# Patient Record
Sex: Female | Born: 2000 | Race: Black or African American | Hispanic: No | Marital: Single | Smoking: Never smoker
Health system: Southern US, Community
[De-identification: ages and names within clinical notes are randomized; demographics above are authoritative.]

## PROBLEM LIST (undated history)

## (undated) ENCOUNTER — Inpatient Hospital Stay (HOSPITAL_COMMUNITY): Payer: Self-pay

## (undated) DIAGNOSIS — T7840XA Allergy, unspecified, initial encounter: Secondary | ICD-10-CM

## (undated) DIAGNOSIS — J45909 Unspecified asthma, uncomplicated: Secondary | ICD-10-CM

## (undated) DIAGNOSIS — L309 Dermatitis, unspecified: Secondary | ICD-10-CM

## (undated) DIAGNOSIS — D509 Iron deficiency anemia, unspecified: Secondary | ICD-10-CM

## (undated) HISTORY — DX: Unspecified asthma, uncomplicated: J45.909

## (undated) HISTORY — DX: Dermatitis, unspecified: L30.9

## (undated) HISTORY — DX: Allergy, unspecified, initial encounter: T78.40XA

## (undated) HISTORY — PX: NO PAST SURGERIES: SHX2092

## (undated) HISTORY — DX: Iron deficiency anemia, unspecified: D50.9

---
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
## 2020-01-03 DIAGNOSIS — F913 Oppositional defiant disorder: Secondary | ICD-10-CM | POA: Diagnosis not present

## 2020-01-10 DIAGNOSIS — F913 Oppositional defiant disorder: Secondary | ICD-10-CM | POA: Diagnosis not present

## 2020-02-16 ENCOUNTER — Encounter: Payer: Self-pay | Admitting: Family Medicine

## 2020-02-16 ENCOUNTER — Other Ambulatory Visit: Payer: Self-pay

## 2020-02-16 ENCOUNTER — Ambulatory Visit (INDEPENDENT_AMBULATORY_CARE_PROVIDER_SITE_OTHER): Payer: Medicaid Other | Admitting: Family Medicine

## 2020-02-16 DIAGNOSIS — Z30013 Encounter for initial prescription of injectable contraceptive: Secondary | ICD-10-CM

## 2020-02-16 DIAGNOSIS — J4520 Mild intermittent asthma, uncomplicated: Secondary | ICD-10-CM | POA: Diagnosis not present

## 2020-02-16 LAB — POCT URINE PREGNANCY: Preg Test, Ur: NEGATIVE

## 2020-02-16 MED ORDER — MEDROXYPROGESTERONE ACETATE 150 MG/ML IM SUSP
150.0000 mg | Freq: Once | INTRAMUSCULAR | Status: AC
  Administered 2020-02-16: 09:00:00 150 mg via INTRAMUSCULAR

## 2020-02-16 NOTE — Assessment & Plan Note (Signed)
Advised patient to get the Covid vaccine however she declined.  She has received influenza vaccine.  On exam, mild wheezes diffusely.  Advised patient to use her albuterol inhaler when she returns home.

## 2020-02-16 NOTE — Patient Instructions (Addendum)
Glad to hear that you and baby [place] are doing well!  Please schedule your next Depo-Provera shot between March 16 - May 16, 2020.  For the next week continue to use barrier protection pregnancy.  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  If you have any questions or concerns do not hesitate to call the office at [phone].  Take Care,   Dr.Keana Dueitt

## 2020-02-16 NOTE — Progress Notes (Signed)
   SUBJECTIVE:   CHIEF COMPLAINT / HPI:   Chief Complaint  Patient presents with  . Postpartum Care     Heather [name] is a 19 y.o. female who presents for a postpartum visit. She is 9 weeks postpartum following a spontaneous vaginal delivery. I have fully reviewed the prenatal and intrapartum course. The delivery was at 40 gestational weeks. Outcome: spontaneous vaginal delivery. Anesthesia: epidural. Postpartum course has been uncomplicated. Baby's course has been uncomplicated. Baby is feeding by both breast and bottle. No bleeding. Bowel function is normal. Bladder function is normal. Patient is sexually active.  Patient would like for Provera prescription.  LMP 01/20/20 bleeding was normal however she experienced more cramping with this period.   Objective:    BP 122/70   Pulse 83   Ht 5\' 5"  (1.651 m)   Wt 230 lb 12.8 oz (104.7 kg)   LMP 01/20/2019 (Approximate)   SpO2 99%   BMI 38.41 kg/m   General:  alert, appears stated age and no distress  Lungs: wheezes bilaterally, no increased work of breathing  Heart:  regular rate and rhythm, S1, S2 normal, no murmur, click, rub or gallop  Abdomen: soft, non-tender; bowel sounds normal; no masses,  no organomegaly    Skin:  warm dry         ASSESSMENT/PLAN:     Asthma Advised patient to get the Covid vaccine however she declined.  She has received influenza vaccine.  On exam, mild wheezes diffusely.  Advised patient to use her albuterol inhaler when she returns home.     Rosalynd was seen today for postpartum care.  Pap smear: N/A (age)   1.  Urine pregnancy test negative.  Contraception: Depo-Provera injections.  Next shot March 16 - May 16, 2020. 2.  Postpartum depression screen negative.  Follow up in: 3 months or as needed. reviewed and updated as appropriate       May 18, 2020, DO PGY-2, [facility] 02/16/2020

## 2020-04-26 DIAGNOSIS — F913 Oppositional defiant disorder: Secondary | ICD-10-CM | POA: Diagnosis not present

## 2020-05-03 DIAGNOSIS — F913 Oppositional defiant disorder: Secondary | ICD-10-CM | POA: Diagnosis not present

## 2020-05-10 DIAGNOSIS — F913 Oppositional defiant disorder: Secondary | ICD-10-CM | POA: Diagnosis not present

## 2020-05-17 DIAGNOSIS — F3481 Disruptive mood dysregulation disorder: Secondary | ICD-10-CM | POA: Diagnosis not present

## 2020-05-24 DIAGNOSIS — F913 Oppositional defiant disorder: Secondary | ICD-10-CM | POA: Diagnosis not present

## 2020-05-31 DIAGNOSIS — F913 Oppositional defiant disorder: Secondary | ICD-10-CM | POA: Diagnosis not present

## 2020-06-06 DIAGNOSIS — F913 Oppositional defiant disorder: Secondary | ICD-10-CM | POA: Diagnosis not present

## 2020-06-13 DIAGNOSIS — F913 Oppositional defiant disorder: Secondary | ICD-10-CM | POA: Diagnosis not present

## 2020-06-16 DIAGNOSIS — F3481 Disruptive mood dysregulation disorder: Secondary | ICD-10-CM | POA: Diagnosis not present

## 2020-06-22 DIAGNOSIS — F913 Oppositional defiant disorder: Secondary | ICD-10-CM | POA: Diagnosis not present

## 2020-06-28 DIAGNOSIS — F913 Oppositional defiant disorder: Secondary | ICD-10-CM | POA: Diagnosis not present

## 2020-07-04 DIAGNOSIS — F913 Oppositional defiant disorder: Secondary | ICD-10-CM | POA: Diagnosis not present

## 2020-07-10 DIAGNOSIS — F913 Oppositional defiant disorder: Secondary | ICD-10-CM | POA: Diagnosis not present

## 2020-07-12 DIAGNOSIS — F913 Oppositional defiant disorder: Secondary | ICD-10-CM | POA: Diagnosis not present

## 2020-07-17 DIAGNOSIS — F3481 Disruptive mood dysregulation disorder: Secondary | ICD-10-CM | POA: Diagnosis not present

## 2020-09-05 DIAGNOSIS — F913 Oppositional defiant disorder: Secondary | ICD-10-CM | POA: Diagnosis not present

## 2020-09-12 DIAGNOSIS — F913 Oppositional defiant disorder: Secondary | ICD-10-CM | POA: Diagnosis not present

## 2020-09-19 DIAGNOSIS — F913 Oppositional defiant disorder: Secondary | ICD-10-CM | POA: Diagnosis not present

## 2020-09-26 DIAGNOSIS — F913 Oppositional defiant disorder: Secondary | ICD-10-CM | POA: Diagnosis not present

## 2020-10-02 DIAGNOSIS — F913 Oppositional defiant disorder: Secondary | ICD-10-CM | POA: Diagnosis not present

## 2020-10-10 DIAGNOSIS — F3481 Disruptive mood dysregulation disorder: Secondary | ICD-10-CM | POA: Diagnosis not present

## 2020-10-17 DIAGNOSIS — F3481 Disruptive mood dysregulation disorder: Secondary | ICD-10-CM | POA: Diagnosis not present

## 2020-10-24 DIAGNOSIS — F3481 Disruptive mood dysregulation disorder: Secondary | ICD-10-CM | POA: Diagnosis not present

## 2020-10-31 DIAGNOSIS — F913 Oppositional defiant disorder: Secondary | ICD-10-CM | POA: Diagnosis not present

## 2020-11-07 DIAGNOSIS — F913 Oppositional defiant disorder: Secondary | ICD-10-CM | POA: Diagnosis not present

## 2020-11-14 DIAGNOSIS — F913 Oppositional defiant disorder: Secondary | ICD-10-CM | POA: Diagnosis not present

## 2020-11-20 DIAGNOSIS — F913 Oppositional defiant disorder: Secondary | ICD-10-CM | POA: Diagnosis not present

## 2020-11-29 DIAGNOSIS — F913 Oppositional defiant disorder: Secondary | ICD-10-CM | POA: Diagnosis not present

## 2020-12-04 DIAGNOSIS — F913 Oppositional defiant disorder: Secondary | ICD-10-CM | POA: Diagnosis not present

## 2020-12-11 DIAGNOSIS — F913 Oppositional defiant disorder: Secondary | ICD-10-CM | POA: Diagnosis not present

## 2020-12-19 DIAGNOSIS — F913 Oppositional defiant disorder: Secondary | ICD-10-CM | POA: Diagnosis not present

## 2020-12-26 DIAGNOSIS — F913 Oppositional defiant disorder: Secondary | ICD-10-CM | POA: Diagnosis not present

## 2021-01-01 DIAGNOSIS — F913 Oppositional defiant disorder: Secondary | ICD-10-CM | POA: Diagnosis not present

## 2021-01-22 DIAGNOSIS — F913 Oppositional defiant disorder: Secondary | ICD-10-CM | POA: Diagnosis not present

## 2021-01-29 DIAGNOSIS — F913 Oppositional defiant disorder: Secondary | ICD-10-CM | POA: Diagnosis not present

## 2021-02-01 DIAGNOSIS — F913 Oppositional defiant disorder: Secondary | ICD-10-CM | POA: Diagnosis not present

## 2021-02-07 DIAGNOSIS — F913 Oppositional defiant disorder: Secondary | ICD-10-CM | POA: Diagnosis not present

## 2021-02-14 DIAGNOSIS — F913 Oppositional defiant disorder: Secondary | ICD-10-CM | POA: Diagnosis not present

## 2021-02-20 DIAGNOSIS — F913 Oppositional defiant disorder: Secondary | ICD-10-CM | POA: Diagnosis not present

## 2021-02-27 DIAGNOSIS — F913 Oppositional defiant disorder: Secondary | ICD-10-CM | POA: Diagnosis not present

## 2021-03-06 DIAGNOSIS — F913 Oppositional defiant disorder: Secondary | ICD-10-CM | POA: Diagnosis not present

## 2021-03-13 DIAGNOSIS — F913 Oppositional defiant disorder: Secondary | ICD-10-CM | POA: Diagnosis not present

## 2021-03-20 DIAGNOSIS — F913 Oppositional defiant disorder: Secondary | ICD-10-CM | POA: Diagnosis not present

## 2021-03-26 DIAGNOSIS — F913 Oppositional defiant disorder: Secondary | ICD-10-CM | POA: Diagnosis not present

## 2021-04-02 DIAGNOSIS — F913 Oppositional defiant disorder: Secondary | ICD-10-CM | POA: Diagnosis not present

## 2021-04-06 DIAGNOSIS — F3481 Disruptive mood dysregulation disorder: Secondary | ICD-10-CM | POA: Diagnosis not present

## 2021-04-11 DIAGNOSIS — F3481 Disruptive mood dysregulation disorder: Secondary | ICD-10-CM | POA: Diagnosis not present

## 2021-04-16 DIAGNOSIS — F913 Oppositional defiant disorder: Secondary | ICD-10-CM | POA: Diagnosis not present

## 2021-04-20 DIAGNOSIS — F913 Oppositional defiant disorder: Secondary | ICD-10-CM | POA: Diagnosis not present

## 2021-04-25 DIAGNOSIS — F913 Oppositional defiant disorder: Secondary | ICD-10-CM | POA: Diagnosis not present

## 2021-05-02 DIAGNOSIS — F913 Oppositional defiant disorder: Secondary | ICD-10-CM | POA: Diagnosis not present

## 2021-05-09 DIAGNOSIS — F913 Oppositional defiant disorder: Secondary | ICD-10-CM | POA: Diagnosis not present

## 2021-05-16 DIAGNOSIS — F913 Oppositional defiant disorder: Secondary | ICD-10-CM | POA: Diagnosis not present

## 2021-05-23 DIAGNOSIS — F913 Oppositional defiant disorder: Secondary | ICD-10-CM | POA: Diagnosis not present

## 2021-05-29 DIAGNOSIS — F913 Oppositional defiant disorder: Secondary | ICD-10-CM | POA: Diagnosis not present

## 2021-06-04 DIAGNOSIS — F913 Oppositional defiant disorder: Secondary | ICD-10-CM | POA: Diagnosis not present

## 2021-06-08 DIAGNOSIS — F913 Oppositional defiant disorder: Secondary | ICD-10-CM | POA: Diagnosis not present

## 2021-06-14 DIAGNOSIS — F913 Oppositional defiant disorder: Secondary | ICD-10-CM | POA: Diagnosis not present

## 2021-06-20 DIAGNOSIS — F913 Oppositional defiant disorder: Secondary | ICD-10-CM | POA: Diagnosis not present

## 2021-06-27 DIAGNOSIS — F913 Oppositional defiant disorder: Secondary | ICD-10-CM | POA: Diagnosis not present

## 2021-07-03 DIAGNOSIS — F913 Oppositional defiant disorder: Secondary | ICD-10-CM | POA: Diagnosis not present

## 2021-07-09 DIAGNOSIS — F913 Oppositional defiant disorder: Secondary | ICD-10-CM | POA: Diagnosis not present

## 2021-07-13 DIAGNOSIS — F913 Oppositional defiant disorder: Secondary | ICD-10-CM | POA: Diagnosis not present

## 2021-07-19 DIAGNOSIS — F913 Oppositional defiant disorder: Secondary | ICD-10-CM | POA: Diagnosis not present

## 2021-07-24 ENCOUNTER — Encounter: Payer: Self-pay | Admitting: *Deleted

## 2021-07-25 DIAGNOSIS — F913 Oppositional defiant disorder: Secondary | ICD-10-CM | POA: Diagnosis not present

## 2021-07-30 DIAGNOSIS — F913 Oppositional defiant disorder: Secondary | ICD-10-CM | POA: Diagnosis not present

## 2021-08-02 DIAGNOSIS — F913 Oppositional defiant disorder: Secondary | ICD-10-CM | POA: Diagnosis not present

## 2021-08-07 DIAGNOSIS — F913 Oppositional defiant disorder: Secondary | ICD-10-CM | POA: Diagnosis not present

## 2021-08-13 DIAGNOSIS — F913 Oppositional defiant disorder: Secondary | ICD-10-CM | POA: Diagnosis not present

## 2021-08-20 DIAGNOSIS — F913 Oppositional defiant disorder: Secondary | ICD-10-CM | POA: Diagnosis not present

## 2021-09-24 DIAGNOSIS — F913 Oppositional defiant disorder: Secondary | ICD-10-CM | POA: Diagnosis not present

## 2021-10-01 DIAGNOSIS — F913 Oppositional defiant disorder: Secondary | ICD-10-CM | POA: Diagnosis not present

## 2021-10-08 DIAGNOSIS — F913 Oppositional defiant disorder: Secondary | ICD-10-CM | POA: Diagnosis not present

## 2021-10-12 DIAGNOSIS — F913 Oppositional defiant disorder: Secondary | ICD-10-CM | POA: Diagnosis not present

## 2021-10-18 DIAGNOSIS — F913 Oppositional defiant disorder: Secondary | ICD-10-CM | POA: Diagnosis not present

## 2021-10-24 DIAGNOSIS — F913 Oppositional defiant disorder: Secondary | ICD-10-CM | POA: Diagnosis not present

## 2021-10-30 DIAGNOSIS — F913 Oppositional defiant disorder: Secondary | ICD-10-CM | POA: Diagnosis not present

## 2021-11-05 DIAGNOSIS — F913 Oppositional defiant disorder: Secondary | ICD-10-CM | POA: Diagnosis not present

## 2021-11-09 DIAGNOSIS — F913 Oppositional defiant disorder: Secondary | ICD-10-CM | POA: Diagnosis not present

## 2021-11-14 DIAGNOSIS — F913 Oppositional defiant disorder: Secondary | ICD-10-CM | POA: Diagnosis not present

## 2021-11-21 DIAGNOSIS — F913 Oppositional defiant disorder: Secondary | ICD-10-CM | POA: Diagnosis not present

## 2021-11-27 DIAGNOSIS — F913 Oppositional defiant disorder: Secondary | ICD-10-CM | POA: Diagnosis not present

## 2021-11-30 DIAGNOSIS — F913 Oppositional defiant disorder: Secondary | ICD-10-CM | POA: Diagnosis not present

## 2021-12-07 DIAGNOSIS — F913 Oppositional defiant disorder: Secondary | ICD-10-CM | POA: Diagnosis not present

## 2021-12-14 DIAGNOSIS — F913 Oppositional defiant disorder: Secondary | ICD-10-CM | POA: Diagnosis not present

## 2021-12-20 DIAGNOSIS — F419 Anxiety disorder, unspecified: Secondary | ICD-10-CM | POA: Diagnosis not present

## 2021-12-25 DIAGNOSIS — F419 Anxiety disorder, unspecified: Secondary | ICD-10-CM | POA: Diagnosis not present

## 2022-01-01 DIAGNOSIS — F419 Anxiety disorder, unspecified: Secondary | ICD-10-CM | POA: Diagnosis not present

## 2022-01-07 DIAGNOSIS — F913 Oppositional defiant disorder: Secondary | ICD-10-CM | POA: Diagnosis not present

## 2022-01-11 DIAGNOSIS — F913 Oppositional defiant disorder: Secondary | ICD-10-CM | POA: Diagnosis not present

## 2022-01-16 DIAGNOSIS — F419 Anxiety disorder, unspecified: Secondary | ICD-10-CM | POA: Diagnosis not present

## 2022-01-21 DIAGNOSIS — F419 Anxiety disorder, unspecified: Secondary | ICD-10-CM | POA: Diagnosis not present

## 2022-01-25 DIAGNOSIS — F419 Anxiety disorder, unspecified: Secondary | ICD-10-CM | POA: Diagnosis not present

## 2022-01-30 DIAGNOSIS — F419 Anxiety disorder, unspecified: Secondary | ICD-10-CM | POA: Diagnosis not present

## 2022-02-06 DIAGNOSIS — F419 Anxiety disorder, unspecified: Secondary | ICD-10-CM | POA: Diagnosis not present

## 2022-02-13 DIAGNOSIS — F419 Anxiety disorder, unspecified: Secondary | ICD-10-CM | POA: Diagnosis not present

## 2022-02-20 DIAGNOSIS — F419 Anxiety disorder, unspecified: Secondary | ICD-10-CM | POA: Diagnosis not present

## 2022-02-22 ENCOUNTER — Ambulatory Visit (INDEPENDENT_AMBULATORY_CARE_PROVIDER_SITE_OTHER): Payer: Medicaid Other | Admitting: Student

## 2022-02-22 VITALS — BP 114/76 | HR 93 | Temp 97.9°F | Ht 65.0 in | Wt 222.2 lb

## 2022-02-22 DIAGNOSIS — J4520 Mild intermittent asthma, uncomplicated: Secondary | ICD-10-CM | POA: Diagnosis not present

## 2022-02-22 DIAGNOSIS — J069 Acute upper respiratory infection, unspecified: Secondary | ICD-10-CM

## 2022-02-22 DIAGNOSIS — L308 Other specified dermatitis: Secondary | ICD-10-CM

## 2022-02-22 DIAGNOSIS — R0981 Nasal congestion: Secondary | ICD-10-CM | POA: Diagnosis not present

## 2022-02-22 MED ORDER — ALBUTEROL SULFATE 108 (90 BASE) MCG/ACT IN AEPB: 2.0000 | INHALATION_SPRAY | Freq: Four times a day (QID) | RESPIRATORY_TRACT | 11 refills | Status: DC | PRN

## 2022-02-22 MED ORDER — FLUTICASONE PROPIONATE 50 MCG/ACT NA SUSP: 1.0000 | Freq: Every day | NASAL | 2 refills | Status: AC | PRN

## 2022-02-22 MED ORDER — TRIAMCINOLONE ACETONIDE 0.1 % EX CREA: 1.0000 | TOPICAL_CREAM | Freq: Every day | CUTANEOUS | 2 refills | Status: AC

## 2022-02-22 NOTE — Patient Instructions (Signed)
Thank you for coming in today!    Fluids: make sure you drink enough Pedialyte or Gatorade is okay too if you aren't eating normally.   Eating or drinking warm liquids such as tea or chicken soup may help with nasal congestion   Treatment: there is no medication for a cold - for kids 1 years or older: give 1 tablespoon of honey 3-4 times a day - for kids younger than 22 years old you can give 1 tablespoon of agave nectar 3-4 times a day.   - Chamomile tea has antiviral properties. For children > 52 months of age you may give 1-2 ounces of chamomile tea twice daily    I will call with the results  Shalamar Plourde Zigmund Daniel

## 2022-02-22 NOTE — Assessment & Plan Note (Signed)
Severe, triamcinolone refill given

## 2022-02-22 NOTE — Progress Notes (Signed)
SUBJECTIVE:   CHIEF COMPLAINT / HPI:   Congestion:  Patient presenting for acute visit with HA, body aches and congestion onset 2 days ago. PO intake is decreased but trying to eat soup, drinking some. Taste has been decreased. Voiding and normal BM. Fever to 101. Feels that the symptoms are limited to the upper respiratory tract.  Her kids are now sick.   Asthma hx, used inhaler since she has been sick but generally does not need it.   PERTINENT  PMH / PSH: Allergy, Anemia, Asthma, eczema   OBJECTIVE:  BP 114/76   Pulse 93   Temp 97.9 F (36.6 C)   Ht 5\' 5"  (1.651 m)   Wt 222 lb 3.2 oz (100.8 kg)   LMP 02/17/2022   SpO2 100%   BMI 36.98 kg/m  Physical Exam Vitals reviewed.  Constitutional:      Appearance: Normal appearance.  HENT:     Head: Normocephalic.     Nose: Nose normal.     Mouth/Throat:     Mouth: Mucous membranes are moist.  Eyes:     Conjunctiva/sclera: Conjunctivae normal.  Cardiovascular:     Rate and Rhythm: Normal rate and regular rhythm.  Pulmonary:     Effort: Pulmonary effort is normal. No respiratory distress.     Breath sounds: Wheezing (slight expiratory wheeze) present.  Musculoskeletal:     Cervical back: Normal range of motion.  Skin:    Capillary Refill: Capillary refill takes less than 2 seconds.     Comments: Eczema with hyperpigmentation over feet and lower extremities   Neurological:     General: No focal deficit present.     Mental Status: She is alert.  Psychiatric:        Mood and Affect: Mood normal.        Behavior: Behavior normal.      ASSESSMENT/PLAN:  Viral upper respiratory tract infection Assessment & Plan: Likely viral in origin.  Acute cough could also be caused by an exacerbation of an upper airway cough syndrome secondary to rhinosinusitis, asthma, COPD, or pneumonia. Unlikely PNA with no focal diminishment on exam or systemic symptoms. No history of COPD, asthma seems well controlled today with rx given to patient  for refill. No signs of GERD (treat with PPI), or rhinosinusitis. Honey can provide symptomatic relief, can also try humidifier at home, Tylenol/Ibuprofen as needed. If symptoms remain in the next couple of weeks or worsen, patient was instructed to return.     Orders: -     Coronavirus ([id number]) with Influenza A and Influenza B -     Albuterol Sulfate; Inhale 2 puffs into the lungs every 6 (six) hours as needed (wheezing and shortness of breath).  Dispense: 1 each; Refill: 11 -     Fluticasone Propionate; Place 1-2 sprays into both nostrils daily as needed for allergies.  Dispense: 9.9 mL; Refill: 2  Other eczema Assessment & Plan: Severe, triamcinolone refill given   Orders: -     Triamcinolone Acetonide; Apply 1 Application topically daily.  Dispense: 30 g; Refill: 2  Mild intermittent asthma without complication -     Albuterol Sulfate; Inhale 2 puffs into the lungs every 6 (six) hours as needed (wheezing and shortness of breath).  Dispense: 1 each; Refill: 11 -     Fluticasone Propionate; Place 1-2 sprays into both nostrils daily as needed for allergies.  Dispense: 9.9 mL; Refill: 2    Erskine Emery, MD 02/22/2022, 1:09 PM    PGY-2,  [place]

## 2022-02-22 NOTE — Assessment & Plan Note (Signed)
Likely viral in origin.  Acute cough could also be caused by an exacerbation of an upper airway cough syndrome secondary to rhinosinusitis, asthma, COPD, or pneumonia. Unlikely PNA with no focal diminishment on exam or systemic symptoms. No history of COPD, asthma seems well controlled today with rx given to patient for refill. No signs of GERD (treat with PPI), or rhinosinusitis. Honey can provide symptomatic relief, can also try humidifier at home, Tylenol/Ibuprofen as needed. If symptoms remain in the next couple of weeks or worsen, patient was instructed to return.

## 2022-02-24 LAB — COVID-19, FLU A+B NAA
Influenza A, NAA: NOT DETECTED
Influenza B, NAA: NOT DETECTED
SARS-CoV-2, NAA: NOT DETECTED

## 2022-02-27 DIAGNOSIS — F419 Anxiety disorder, unspecified: Secondary | ICD-10-CM | POA: Diagnosis not present

## 2022-03-02 ENCOUNTER — Encounter: Payer: Self-pay | Admitting: Student

## 2022-03-05 DIAGNOSIS — F419 Anxiety disorder, unspecified: Secondary | ICD-10-CM | POA: Diagnosis not present

## 2022-03-13 DIAGNOSIS — F419 Anxiety disorder, unspecified: Secondary | ICD-10-CM | POA: Diagnosis not present

## 2022-03-20 DIAGNOSIS — F419 Anxiety disorder, unspecified: Secondary | ICD-10-CM | POA: Diagnosis not present

## 2022-03-21 ENCOUNTER — Other Ambulatory Visit (HOSPITAL_COMMUNITY): Payer: Self-pay

## 2022-03-21 ENCOUNTER — Telehealth: Payer: Self-pay

## 2022-03-21 NOTE — Telephone Encounter (Signed)
Rec'd PA request from pharmacy regarding patients proair inhaler.   Patients insurance covers Ventolin HFA. Could this inhaler be sent in? (Ventolin 18 gram inhaler)

## 2022-03-22 MED ORDER — ALBUTEROL SULFATE HFA 108 (90 BASE) MCG/ACT IN AERS: 2.0000 | INHALATION_SPRAY | RESPIRATORY_TRACT | 2 refills | Status: DC | PRN

## 2022-03-22 NOTE — Addendum Note (Signed)
Addended by: Erskine Emery on: 03/22/2022 12:17 PM   Modules accepted: Orders

## 2022-03-27 DIAGNOSIS — F419 Anxiety disorder, unspecified: Secondary | ICD-10-CM | POA: Diagnosis not present

## 2022-04-03 DIAGNOSIS — F419 Anxiety disorder, unspecified: Secondary | ICD-10-CM | POA: Diagnosis not present

## 2022-04-30 DIAGNOSIS — F419 Anxiety disorder, unspecified: Secondary | ICD-10-CM | POA: Diagnosis not present

## 2022-05-07 DIAGNOSIS — F419 Anxiety disorder, unspecified: Secondary | ICD-10-CM | POA: Diagnosis not present

## 2022-05-14 DIAGNOSIS — F419 Anxiety disorder, unspecified: Secondary | ICD-10-CM | POA: Diagnosis not present

## 2022-05-21 DIAGNOSIS — F419 Anxiety disorder, unspecified: Secondary | ICD-10-CM | POA: Diagnosis not present

## 2022-05-28 DIAGNOSIS — F419 Anxiety disorder, unspecified: Secondary | ICD-10-CM | POA: Diagnosis not present

## 2022-05-31 DIAGNOSIS — F419 Anxiety disorder, unspecified: Secondary | ICD-10-CM | POA: Diagnosis not present

## 2022-06-05 DIAGNOSIS — F419 Anxiety disorder, unspecified: Secondary | ICD-10-CM | POA: Diagnosis not present

## 2022-06-11 DIAGNOSIS — F411 Generalized anxiety disorder: Secondary | ICD-10-CM | POA: Diagnosis not present

## 2022-06-18 DIAGNOSIS — F419 Anxiety disorder, unspecified: Secondary | ICD-10-CM | POA: Diagnosis not present

## 2022-06-21 DIAGNOSIS — F419 Anxiety disorder, unspecified: Secondary | ICD-10-CM | POA: Diagnosis not present

## 2022-06-25 DIAGNOSIS — F419 Anxiety disorder, unspecified: Secondary | ICD-10-CM | POA: Diagnosis not present

## 2022-07-02 DIAGNOSIS — F419 Anxiety disorder, unspecified: Secondary | ICD-10-CM | POA: Diagnosis not present

## 2022-07-09 DIAGNOSIS — F419 Anxiety disorder, unspecified: Secondary | ICD-10-CM | POA: Diagnosis not present

## 2022-07-16 DIAGNOSIS — F413 Other mixed anxiety disorders: Secondary | ICD-10-CM | POA: Diagnosis not present

## 2022-07-22 DIAGNOSIS — F419 Anxiety disorder, unspecified: Secondary | ICD-10-CM | POA: Diagnosis not present

## 2022-07-29 DIAGNOSIS — F419 Anxiety disorder, unspecified: Secondary | ICD-10-CM | POA: Diagnosis not present

## 2022-09-03 ENCOUNTER — Ambulatory Visit (INDEPENDENT_AMBULATORY_CARE_PROVIDER_SITE_OTHER): Payer: Medicaid Other | Admitting: Student

## 2022-09-03 ENCOUNTER — Other Ambulatory Visit (HOSPITAL_COMMUNITY)
Admission: RE | Admit: 2022-09-03 | Discharge: 2022-09-03 | Disposition: A | Discharge status: Home / Self Care | Payer: Medicaid Other | Source: Ambulatory Visit | Attending: Family Medicine | Admitting: Family Medicine

## 2022-09-03 ENCOUNTER — Encounter: Payer: Self-pay | Admitting: Student

## 2022-09-03 VITALS — BP 125/81 | HR 79 | Ht 64.0 in | Wt 202.2 lb

## 2022-09-03 DIAGNOSIS — Z0000 Encounter for general adult medical examination without abnormal findings: Secondary | ICD-10-CM | POA: Diagnosis not present

## 2022-09-03 DIAGNOSIS — Z113 Encounter for screening for infections with a predominantly sexual mode of transmission: Secondary | ICD-10-CM

## 2022-09-03 NOTE — Progress Notes (Signed)
Annual Wellness Visit     Patient: Heather [name], Female    DOB: 10/03/2000, 22 y.o.   MRN: [id number]  Subjective  No chief complaint on file.   Heather [name] is a 22 y.o. female who presents today for her Annual Wellness Visit.  Medical concerns: Notable vaginal discharge for the past 2 weeks.  She has been sexually active with 1 partner who was sexually active with multiple partners.  Recently ended their relationship.  Has had some irritation with voiding but no pain or blood in urine.   Diet: Regular diet but avoids red meat Sleep: Sleep through the night, 6-7 hrs a night  Exercise: Yes, squat and cardios about 2-3x a week  Alcohol use:  Occasionally  Tobacco use: Yes, blacky malls occasionally Illicit drug [id number] Sexually active: Yes, female partner Works as: Leisure centre manager, Gaffer on getting CD[employer] Lives with: Mother Education officer, environmental) Code Status: Full Code   Medications: Outpatient Medications Prior to Visit  Medication Sig   acetaminophen (TYLENOL) 325 MG tablet Take 2 tablets (650 mg total) by mouth every 6 (six) hours.   albuterol (VENTOLIN HFA) 108 (90 Base) MCG/ACT inhaler Inhale 2-4 puffs into the lungs every 4 (four) hours as needed for wheezing (or cough).   coconut oil OIL Apply 1 application topically as needed.   Ferrous Sulfate Dried (FERROUS SULFATE IRON) 200 (65 Fe) MG TABS Take 1 tablet by mouth 2 (two) times daily.   fluticasone (FLONASE) 50 MCG/ACT nasal spray Place 1-2 sprays into both nostrils daily as needed for allergies.   ibuprofen (ADVIL) 600 MG tablet Take 1 tablet (600 mg total) by mouth every 6 (six) hours.   Prenatal Vit-Fe Fumarate-FA (PRENATAL MULTIVITAMIN) TABS tablet Take 1 tablet by mouth daily at 12 noon.   triamcinolone cream (KENALOG) 0.1 % Apply 1 Application topically daily.   No facility-administered medications prior to visit.    Allergies  Allergen Reactions   Dust Mite Extract Hives    Irritation    Pollen Extract Other (See  Comments)    Itchy and watery eyes    Shellfish Allergy Swelling   Objective  BP 125/81   Pulse 79   Ht 5\' 4"  (1.626 m)   Wt 202 lb 4 oz (91.7 kg)   LMP 08/05/2022   SpO2 100%   BMI 34.72 kg/m    General: Alert, well appearing, NAD HEENT: Atraumatic, MMM, No sclera icterus CV: RRR, no murmurs, normal S1/S2 Pulm: CTAB, good WOB on RA, no crackles or wheezing Abd: Soft, no distension, no tenderness Skin: dry, warm Ext: No BLE edema, +2 Pedal and radial pulse.  Genitalia:  Normal introitus for age, no external lesions, white copious vaginal discharge , mucosa pink and moist, no vaginal or cervical lesions, no vaginal atrophy, no friaility or hemorrhage, normal uterus size and position   Chaperone for exam: Tashira  CMA.   Most recent depression screenings:    02/22/2022    9:15 AM 02/16/2020    8:32 AM  PHQ 2/9 Scores  PHQ - 2 Score 0 1  PHQ- 9 Score 0 1    Assessment & Plan   Annual wellness visit done today including the all of the following: Reviewed patient's Family Medical History Reviewed and updated list of patient's medical providers Assessment of cognitive impairment was done Assessed patient's functional ability Established a written schedule for health screening services Health Risk Assessent Completed and Reviewed  Exercise Activities and Dietary recommendations  Goals   None  Immunization History  Administered Date(s) Administered   DTP 05/23/2000, 08/25/2000, 12/23/2000, 07/09/2001, 09/20/2005   HIB (PRP-OMP) 05/23/2000, 08/25/2000, 12/23/2000, 07/09/2001   Hepatitis A 09/20/2005   Hepatitis B 04/23/2000, 12/23/2000   Influenza,inj,Quad PF,6+ Mos 11/26/2019   MMR 07/09/2001, 09/20/2005   OPV 05/23/2000, 08/25/2000, 07/09/2001, 09/20/2005   Pneumococcal Conjugate-13 05/23/2000, 08/25/2000, 12/23/2000   Tdap 10/13/2019   Varicella 04/12/2002    Health Maintenance  Topic Date Due   HPV VACCINES (1 - 3-dose series) Never done   Hepatitis C  Screening  Never done   CHLAMYDIA SCREENING  11/15/2020   PAP-Cervical Cytology Screening  Never done   PAP SMEAR-Modifier  Never done   COVID-19 Vaccine (1 - 2023-24 season) 09/17/2022 (Originally 10/19/2021)   INFLUENZA VACCINE  09/19/2022   DTaP/Tdap/Td (7 - Td or Tdap) 10/12/2029   HIV Screening  Completed     Discussed health benefits of physical activity, and encouraged her to engage in regular exercise appropriate for her age and condition.    Problem List Items Addressed This Visit   None Visit Diagnoses     Encounter for wellness examination in adult    -  Primary   Overall healthy.  Patient is age appropriate and due for Pap smear.   Relevant Orders   CBC   Basic Metabolic Panel   Cytology - PAP([facility])   Screening for STD (sexually transmitted disease)       Sexually active, inconsistent with condom use and on exam has copious white vaginal discharge.   Relevant Orders   HIV antibody (with reflex)   RPR   Cervicovaginal ancillary only       Follow-up as needed.  Jerre Simon, MD

## 2022-09-03 NOTE — Patient Instructions (Addendum)
It was wonderful to meet you today. Thank you for allowing me to be a part of your care. Below is a short summary of what we discussed at your visit today:  Today for your physical you were overall healthy.  We obtained labs today to check your STD including chlamydia, gonorrhea, Hep C, HIV and syphilis.  We collected  lab for your blood count, electrolyte level, kidney function.  We collected your sample for Pap smear today.   We will call you if your test is abnormal.  Please bring all of your medications to every appointment!  If you have any questions or concerns, please do not hesitate to contact us via phone or MyChart message.   Jerre Simon, MD [facility]

## 2022-09-04 LAB — CYTOLOGY - PAP
Chlamydia: NEGATIVE
Comment: NEGATIVE
Comment: NEGATIVE
Comment: NORMAL
Diagnosis: NEGATIVE
Neisseria Gonorrhea: NEGATIVE
Trichomonas: POSITIVE — AB

## 2022-09-04 LAB — CERVICOVAGINAL ANCILLARY ONLY
Bacterial Vaginitis (gardnerella): POSITIVE — AB
Candida Glabrata: NEGATIVE
Candida Vaginitis: POSITIVE — AB
Comment: NEGATIVE
Comment: NEGATIVE
Comment: NEGATIVE

## 2022-09-04 LAB — BASIC METABOLIC PANEL
BUN/Creatinine Ratio: 15 (ref 9–23)
BUN: 11 mg/dL (ref 6–20)
CO2: 21 mmol/L (ref 20–29)
Calcium: 9.1 mg/dL (ref 8.7–10.2)
Chloride: 105 mmol/L (ref 96–106)
Creatinine, Ser: 0.74 mg/dL (ref 0.57–1.00)
Glucose: 77 mg/dL (ref 70–99)
Potassium: 4.3 mmol/L (ref 3.5–5.2)
Sodium: 137 mmol/L (ref 134–144)
eGFR: 117 mL/min/{1.73_m2} (ref >59)

## 2022-09-04 LAB — CBC
Hematocrit: 40.6 % (ref 34.0–46.6)
Hemoglobin: 13 g/dL (ref 11.1–15.9)
MCH: 27 pg (ref 26.6–33.0)
MCHC: 32 g/dL (ref 31.5–35.7)
MCV: 84 fL (ref 79–97)
Platelets: 271 10*3/uL (ref 150–450)
RBC: 4.81 x10E6/uL (ref 3.77–5.28)
RDW: 16.4 % — ABNORMAL HIGH (ref 11.7–15.4)
WBC: 3.5 10*3/uL (ref 3.4–10.8)

## 2022-09-04 LAB — HIV ANTIBODY (ROUTINE TESTING W REFLEX): HIV Screen 4th Generation wRfx: NONREACTIVE

## 2022-09-04 LAB — RPR: RPR Ser Ql: NONREACTIVE

## 2022-09-06 ENCOUNTER — Telehealth: Payer: Self-pay | Admitting: Student

## 2022-09-06 NOTE — Telephone Encounter (Signed)
Called patient to inform her most recent lab work was positive for trichomonas and to send medication for metronidazole.  However unfortunately patient was not available to answer the phone.  Left a generic voicemail which patient would likely call the after-hour line for treatment.  We will try again to call patient if no call back from her.

## 2022-09-11 ENCOUNTER — Telehealth: Payer: Self-pay | Admitting: Student

## 2022-09-11 DIAGNOSIS — A5901 Trichomonal vulvovaginitis: Secondary | ICD-10-CM

## 2022-09-11 MED ORDER — METRONIDAZOLE 500 MG PO TABS: 500.0000 mg | ORAL_TABLET | Freq: Two times a day (BID) | ORAL | 0 refills | Status: AC

## 2022-09-11 NOTE — Telephone Encounter (Signed)
Called and informed patient that her recent labs was positive for Trichomonas.  Sent in prescription for metronidazole 500 mg twice daily for 7 days.  Also advised patient that her partner needs to be treated to avoid reinfection after that treatment.  Patient verbalized understanding and agreeable to plan.

## 2022-10-28 ENCOUNTER — Ambulatory Visit: Payer: Medicaid Other | Admitting: Student

## 2022-11-27 ENCOUNTER — Ambulatory Visit: Payer: Medicaid Other | Admitting: Student

## 2023-01-13 ENCOUNTER — Ambulatory Visit (HOSPITAL_COMMUNITY)
Admission: EM | Admit: 2023-01-13 | Discharge: 2023-01-13 | Disposition: A | Discharge status: Home / Self Care | Payer: Medicaid Other | Attending: Internal Medicine | Admitting: Internal Medicine

## 2023-01-13 ENCOUNTER — Encounter (HOSPITAL_COMMUNITY): Payer: Self-pay | Admitting: *Deleted

## 2023-01-13 ENCOUNTER — Other Ambulatory Visit: Payer: Self-pay

## 2023-01-13 DIAGNOSIS — H66002 Acute suppurative otitis media without spontaneous rupture of ear drum, left ear: Secondary | ICD-10-CM | POA: Diagnosis not present

## 2023-01-13 DIAGNOSIS — H60392 Other infective otitis externa, left ear: Secondary | ICD-10-CM

## 2023-01-13 DIAGNOSIS — J0100 Acute maxillary sinusitis, unspecified: Secondary | ICD-10-CM

## 2023-01-13 MED ORDER — OFLOXACIN 0.3 % OT SOLN: 5.0000 [drp] | Freq: Two times a day (BID) | OTIC | 0 refills | Status: DC

## 2023-01-13 MED ORDER — AMOXICILLIN-POT CLAVULANATE 875-125 MG PO TABS: 1.0000 | ORAL_TABLET | Freq: Two times a day (BID) | ORAL | 0 refills | Status: AC

## 2023-01-13 NOTE — ED Triage Notes (Signed)
Pt reports a sore throat for 2 days . Pt also reports bil ear pain with yellow drainage . Pt also has acough.

## 2023-01-13 NOTE — ED Provider Notes (Signed)
[facility]    CSN: [id number] Arrival date & time: 01/13/23  1430      History   Chief Complaint Chief Complaint  Patient presents with   Ear Drainage   Cough   Sore Throat    HPI Heather [name] is a 22 y.o. female.   22 year old female who was seen in urgent care secondary to sore throat, nasal congestion and ear pain.  She reports that 2 days ago she developed a sore throat with sneezing.  She then started to have significant sinus drainage and congestion.  This caused her sore throat to worsen as it was running down the back of her throat.  She has had some coughing during the day as well.  Yesterday she started to have headaches with bilateral ear pain with the left being worse.  She was using a Q-tip and got yellow wax out of her left ear.  She denies any difficulty hearing.  Did have some body aches and fatigue.  Denies fevers or chills.  Denies shortness of breath or chest pain.   Ear Drainage Pertinent negatives include no chest pain, no abdominal pain and no shortness of breath.  Cough Associated symptoms: ear pain and sore throat   Associated symptoms: no chest pain, no chills, no fever, no rash and no shortness of breath   Sore Throat Pertinent negatives include no chest pain, no abdominal pain and no shortness of breath.    Past Medical History:  Diagnosis Date   Allergy    Anemia, iron deficiency    Asthma    last used inhaler 02/11/18   Eczema     Patient Active Problem List   Diagnosis Date Noted   Nasal congestion 02/22/2022   Postpartum care and examination 02/16/2020   Indication for care or intervention in labor or delivery 12/16/2019   Vaginal delivery 12/16/2019   Acute on chronic anemia 12/16/2019   GERD (gastroesophageal reflux disease) 12/02/2019   Abnormal antenatal AFP screen 07/14/2019   Supervision of low-risk pregnancy, third trimester 04/26/2019   Allergic rhinitis 12/07/2018   Asthma 04/17/2006   Eczema 04/17/2006     Past Surgical History:  Procedure Laterality Date   NO PAST SURGERIES      OB History     Gravida  2   Para  2   Term  2   Preterm  0   AB  0   Living  2      SAB  0   IAB  0   Ectopic  0   Multiple  0   Live Births  2            Home Medications    Prior to Admission medications   Medication Sig Start Date End Date Taking? Authorizing Provider  acetaminophen (TYLENOL) 325 MG tablet Take 2 tablets (650 mg total) by mouth every 6 (six) hours. 12/18/19  Yes Valetta Close, MD  albuterol (VENTOLIN HFA) 108 (90 Base) MCG/ACT inhaler Inhale 2-4 puffs into the lungs every 4 (four) hours as needed for wheezing (or cough). 03/22/22  Yes Alfredo Martinez, MD  coconut oil OIL Apply 1 application topically as needed. 12/18/19  Yes Valetta Close, MD  Ferrous Sulfate Dried (FERROUS SULFATE IRON) 200 (65 Fe) MG TABS Take 1 tablet by mouth 2 (two) times daily. 11/04/19  Yes Brimage, Vondra, DO  triamcinolone cream (KENALOG) 0.1 % Apply 1 Application topically daily. 02/22/22  Yes Alfredo Martinez, MD  fluticasone (FLONASE) 50  MCG/ACT nasal spray Place 1-2 sprays into both nostrils daily as needed for allergies. 02/22/22   Alfredo Martinez, MD  ibuprofen (ADVIL) 600 MG tablet Take 1 tablet (600 mg total) by mouth every 6 (six) hours. 12/18/19   Valetta Close, MD  Prenatal Vit-Fe Fumarate-FA (PRENATAL MULTIVITAMIN) TABS tablet Take 1 tablet by mouth daily at 12 noon. 12/18/19   Valetta Close, MD    Family History Family History  Problem Relation Age of Onset   Hypertension Mother    Diabetes Maternal Grandmother    COPD Maternal Grandmother     Social History Social History   Tobacco Use   Smoking status: Never   Smokeless tobacco: Never  Vaping Use   Vaping status: Never Used  Substance Use Topics   Alcohol use: Not Currently   Drug use: Not Currently     Allergies   Dust mite extract, Pollen extract, and Shellfish allergy   Review of  Systems Review of Systems  Constitutional:  Negative for chills and fever.  HENT:  Positive for congestion, ear discharge, ear pain and sore throat.   Eyes:  Negative for pain and visual disturbance.  Respiratory:  Positive for cough. Negative for shortness of breath.   Cardiovascular:  Negative for chest pain and palpitations.  Gastrointestinal:  Negative for abdominal pain and vomiting.  Genitourinary:  Negative for dysuria and hematuria.  Musculoskeletal:  Negative for arthralgias and back pain.  Skin:  Negative for color change and rash.  Neurological:  Negative for seizures and syncope.  All other systems reviewed and are negative.    Physical Exam Triage Vital Signs ED Triage Vitals  Encounter Vitals Group     BP 01/13/23 1643 114/77     Systolic BP Percentile --      Diastolic BP Percentile --      Pulse Rate 01/13/23 1643 66     Resp 01/13/23 1643 18     Temp 01/13/23 1643 (!) 94.3 F (34.6 C)     Temp src --      SpO2 01/13/23 1643 100 %     Weight --      Height --      Head Circumference --      Peak Flow --      Pain Score 01/13/23 1644 8     Pain Loc --      Pain Education --      Exclude from Growth Chart --    No data found.  Updated Vital Signs BP 114/77   Pulse 66   Temp (!) 94.3 F (34.6 C)   Resp 18   LMP 01/04/2023 (Approximate)   SpO2 100%   Visual Acuity Right Eye Distance:   Left Eye Distance:   Bilateral Distance:    Right Eye Near:   Left Eye Near:    Bilateral Near:     Physical Exam Vitals and nursing note reviewed.  Constitutional:      General: She is not in acute distress.    Appearance: She is well-developed.  HENT:     Head: Normocephalic and atraumatic.     Right Ear: Tympanic membrane normal. Tympanic membrane is not erythematous.     Left Ear: Drainage (thick wax) and tenderness present.     Nose: Congestion and rhinorrhea present.     Mouth/Throat:     Mouth: Mucous membranes are moist.     Pharynx: Posterior  oropharyngeal erythema present.  Eyes:     Conjunctiva/sclera:  Conjunctivae normal.  Cardiovascular:     Rate and Rhythm: Normal rate and regular rhythm.     Heart sounds: No murmur heard. Pulmonary:     Effort: Pulmonary effort is normal. No respiratory distress.     Breath sounds: Normal breath sounds.  Abdominal:     Palpations: Abdomen is soft.     Tenderness: There is no abdominal tenderness.  Musculoskeletal:        General: No swelling.     Cervical back: Neck supple.  Skin:    General: Skin is warm and dry.     Capillary Refill: Capillary refill takes less than 2 seconds.  Neurological:     Mental Status: She is alert.  Psychiatric:        Mood and Affect: Mood normal.      UC Treatments / Results  Labs (all labs ordered are listed, but only abnormal results are displayed) Labs Reviewed - No data to display  EKG   Radiology No results found.  Procedures Procedures (including critical care time)  Medications Ordered in UC Medications - No data to display  Initial Impression / Assessment and Plan / UC Course  I have reviewed the triage vital signs and the nursing notes.  Pertinent labs & imaging results that were available during my care of the patient were reviewed by me and considered in my medical decision making (see chart for details).     Non-recurrent acute suppurative otitis media of left ear without spontaneous rupture of tympanic membrane  Acute non-recurrent maxillary sinusitis  Infective otitis externa of left ear   Likely otitis media (middle ear infection) of the left ear with overlaying sinus infection and otitis externa likely from using a q-tip to try and clean the ear canal out. Will treat with the following:  Augmentin 1 tablet twice daily for 7 days, take with food Ofloxacin 5 drops in left ear twice daily and let it sit. Do this for 7 days Can try over the counter Sudafed to help with congestion.  May use tylenol or ibuprofen for  pain Rest and hydrate Return to urgent care or PCP if symptoms worsen or fail to resolve.    Final Clinical Impressions(s) / UC Diagnoses   Final diagnoses:  None   Discharge Instructions   None    ED Prescriptions   None    PDMP not reviewed this encounter.   Landis Martins, [place] 01/13/23 1721

## 2023-01-13 NOTE — Discharge Instructions (Addendum)
Likely otitis media (middle ear infection) of the left ear with overlaying sinus infection and ear canal infection. Will treat with the following:  Augmentin 1 tablet twice daily for 7 days, take with food Ofloxacin 5 drops in left ear twice daily and let it sit. Do this for 7 days Can try over the counter Sudafed to help with congestion.  May use tylenol or ibuprofen for pain Rest and hydrate Return to urgent care or PCP if symptoms worsen or fail to resolve.

## 2023-02-18 ENCOUNTER — Ambulatory Visit (HOSPITAL_COMMUNITY)
Admission: EM | Admit: 2023-02-18 | Discharge: 2023-02-18 | Disposition: A | Discharge status: Home / Self Care | Payer: Medicaid Other | Attending: Family Medicine | Admitting: Family Medicine

## 2023-02-18 ENCOUNTER — Encounter (HOSPITAL_COMMUNITY): Payer: Self-pay

## 2023-02-18 DIAGNOSIS — R591 Generalized enlarged lymph nodes: Secondary | ICD-10-CM | POA: Diagnosis not present

## 2023-02-18 DIAGNOSIS — B350 Tinea barbae and tinea capitis: Secondary | ICD-10-CM | POA: Diagnosis not present

## 2023-02-18 DIAGNOSIS — Z202 Contact with and (suspected) exposure to infections with a predominantly sexual mode of transmission: Secondary | ICD-10-CM

## 2023-02-18 LAB — HIV ANTIBODY (ROUTINE TESTING W REFLEX): HIV Screen 4th Generation wRfx: NONREACTIVE

## 2023-02-18 MED ORDER — IBUPROFEN 800 MG PO TABS: 800.0000 mg | ORAL_TABLET | Freq: Three times a day (TID) | ORAL | 0 refills | Status: DC | PRN

## 2023-02-18 MED ORDER — TERBINAFINE HCL 250 MG PO TABS: 250.0000 mg | ORAL_TABLET | Freq: Every day | ORAL | 0 refills | Status: AC

## 2023-02-18 NOTE — ED Triage Notes (Addendum)
Pt states she has sores on her scalp for the past week.  Areas of swelling noted to top of head and to right back of head. States it is giving her a headache. No drainage noted. Pt also states she would like to be tested for STD's.  Denies any symptoms.

## 2023-02-18 NOTE — ED Provider Notes (Addendum)
 [facility]    CSN: [id number] Arrival date & time: 02/18/23  1004      History   Chief Complaint Chief Complaint  Patient presents with   sores to scalp    HPI Heather [name] is a 22 y.o. female.   HPI Here for a knot on her right posterior neck and itching and rash on her scalp.  The knot she has noted for about a week.  She has not had any fever in this time.  For little bit longer she has noted some itching and rash on her scalp. She did have an otitis media with a ruptured TM about 5 weeks ago.  That is much improved  No vomiting  She would like to do testing for STDs.  She does have a little bit of abnormal odor and would like to be tested for BV  NKDA  Last menstrual cycle December 17  Past Medical History:  Diagnosis Date   Allergy    Anemia, iron  deficiency    Asthma    last used inhaler 02/11/18   Eczema     Patient Active Problem List   Diagnosis Date Noted   Nasal congestion 02/22/2022   Postpartum care and examination 02/16/2020   Indication for care or intervention in labor or delivery 12/16/2019   Vaginal delivery 12/16/2019   Acute on chronic anemia 12/16/2019   GERD (gastroesophageal reflux disease) 12/02/2019   Abnormal antenatal AFP screen 07/14/2019   Supervision of low-risk pregnancy, third trimester 04/26/2019   Allergic rhinitis 12/07/2018   Asthma 04/17/2006   Eczema 04/17/2006    Past Surgical History:  Procedure Laterality Date   NO PAST SURGERIES      OB History     Gravida  2   Para  2   Term  2   Preterm  0   AB  0   Living  2      SAB  0   IAB  0   Ectopic  0   Multiple  0   Live Births  2            Home Medications    Prior to Admission medications   Medication Sig Start Date End Date Taking? Authorizing Provider  ibuprofen  (ADVIL ) 800 MG tablet Take 1 tablet (800 mg total) by mouth every 8 (eight) hours as needed (pain). 02/18/23  Yes Vonna Sharlet POUR, MD  terbinafine   (LAMISIL ) 250 MG tablet Take 1 tablet (250 mg total) by mouth daily for 14 days. 02/18/23 03/04/23 Yes Vonna Sharlet POUR, MD  acetaminophen  (TYLENOL ) 325 MG tablet Take 2 tablets (650 mg total) by mouth every 6 (six) hours. 12/18/19   Macario Dorothyann HERO, MD  albuterol  (VENTOLIN  HFA) [phone] Base) MCG/ACT inhaler Inhale 2-4 puffs into the lungs every 4 (four) hours as needed for wheezing (or cough). 03/22/22   Bryan Bianchi, MD  coconut oil OIL Apply 1 application topically as needed. 12/18/19   Macario Dorothyann HERO, MD  Ferrous Sulfate  Dried (FERROUS SULFATE  IRON ) 200 (65 Fe) MG TABS Take 1 tablet by mouth 2 (two) times daily. 11/04/19   Brimage, Vondra, DO  fluticasone  (FLONASE ) 50 MCG/ACT nasal spray Place 1-2 sprays into both nostrils daily as needed for allergies. 02/22/22   Bryan Bianchi, MD  Prenatal Vit-Fe Fumarate-FA (PRENATAL MULTIVITAMIN) TABS tablet Take 1 tablet by mouth daily at 12 noon. 12/18/19   Macario Dorothyann HERO, MD  triamcinolone  cream (KENALOG ) 0.1 % Apply 1 Application topically daily.  02/22/22   Bryan Bianchi, MD    Family History Family History  Problem Relation Age of Onset   Hypertension Mother    Diabetes Maternal Grandmother    COPD Maternal Grandmother     Social History Social History   Tobacco Use   Smoking status: Never   Smokeless tobacco: Never  Vaping Use   Vaping status: Never Used  Substance Use Topics   Alcohol use: Not Currently   Drug use: Not Currently     Allergies   Dust mite extract, Pollen extract, and Shellfish allergy   Review of Systems Review of Systems   Physical Exam Triage Vital Signs ED Triage Vitals  Encounter Vitals Group     BP 02/18/23 1119 120/81     Systolic BP Percentile --      Diastolic BP Percentile --      Pulse Rate 02/18/23 1119 76     Resp 02/18/23 1119 16     Temp 02/18/23 1119 98.7 F (37.1 C)     Temp Source 02/18/23 1119 Oral     SpO2 02/18/23 1119 98 %     Weight --      Height --      Head  Circumference --      Peak Flow --      Pain Score 02/18/23 1122 7     Pain Loc --      Pain Education --      Exclude from Growth Chart --    No data found.  Updated Vital Signs BP 120/81 (BP Location: Right Arm)   Pulse 76   Temp 98.7 F (37.1 C) (Oral)   Resp 16   LMP 02/04/2023 (Approximate)   SpO2 98%   Breastfeeding No   Visual Acuity Right Eye Distance:   Left Eye Distance:   Bilateral Distance:    Right Eye Near:   Left Eye Near:    Bilateral Near:     Physical Exam Vitals reviewed.  Constitutional:      General: She is not in acute distress.    Appearance: She is not ill-appearing, toxic-appearing or diaphoretic.  HENT:     Mouth/Throat:     Mouth: Mucous membranes are moist.  Eyes:     Extraocular Movements: Extraocular movements intact.     Conjunctiva/sclera: Conjunctivae normal.     Pupils: Pupils are equal, round, and reactive to light.  Neck:     Comments: There is an enlarged lymph node at the posterior neck at the hairline.  No fluctuance. Skin:    Coloration: Skin is not jaundiced or pale.     Comments: There is spotty scaly rash on her scalp noted throughout the scalp with a little bit of hair loss in those areas.  Neurological:     General: No focal deficit present.     Mental Status: She is alert and oriented to person, place, and time.  Psychiatric:        Behavior: Behavior normal.      UC Treatments / Results  Labs (all labs ordered are listed, but only abnormal results are displayed) Labs Reviewed  RPR  HIV ANTIBODY (ROUTINE TESTING W REFLEX)  CERVICOVAGINAL ANCILLARY ONLY    EKG   Radiology No results found.  Procedures Procedures (including critical care time)  Medications Ordered in UC Medications - No data to display  Initial Impression / Assessment and Plan / UC Course  I have reviewed the triage vital signs and the nursing notes.  Pertinent labs & imaging results that were available during my care of the  patient were reviewed by me and considered in my medical decision making (see chart for details).     Blood is drawn to check HIV and RPR. Vaginal self swab is done, and we will notify of any positives on that and treat per protocol.  Terbinafine  is sent in to treat the tinea capitis.  Advised to follow-up with her primary care. Final Clinical Impressions(s) / UC Diagnoses   Final diagnoses:  Tinea capitis  Lymphadenopathy  Exposure to STD     Discharge Instructions      Take ibuprofen  800 mg--1 tab every 8 hours as needed for pain.  Terbinafine  250 mg--take 1 tablet daily for 2 weeks; this is for the fungal infection  Please follow-up with your primary care     ED Prescriptions     Medication Sig Dispense Auth. Provider   terbinafine  (LAMISIL ) 250 MG tablet Take 1 tablet (250 mg total) by mouth daily for 14 days. 14 tablet Fermin Yan, Sharlet POUR, MD   ibuprofen  (ADVIL ) 800 MG tablet Take 1 tablet (800 mg total) by mouth every 8 (eight) hours as needed (pain). 21 tablet Kristeena Meineke K, MD      PDMP not reviewed this encounter.   Vonna Sharlet POUR, MD 02/18/23 1148    Vonna Sharlet POUR, MD 02/18/23 [id number]

## 2023-02-18 NOTE — Discharge Instructions (Addendum)
 Take ibuprofen 800 mg--1 tab every 8 hours as needed for pain.  Terbinafine 250 mg--take 1 tablet daily for 2 weeks; this is for the fungal infection  Please follow-up with your primary care

## 2023-02-19 LAB — RPR: RPR Ser Ql: NONREACTIVE

## 2023-02-20 LAB — CERVICOVAGINAL ANCILLARY ONLY
Bacterial Vaginitis (gardnerella): POSITIVE — AB
Candida Glabrata: NEGATIVE
Candida Vaginitis: POSITIVE — AB
Chlamydia: POSITIVE — AB
Comment: NEGATIVE
Comment: NEGATIVE
Comment: NEGATIVE
Comment: NEGATIVE
Comment: NEGATIVE
Comment: NORMAL
Neisseria Gonorrhea: NEGATIVE
Trichomonas: POSITIVE — AB

## 2023-02-21 ENCOUNTER — Telehealth: Payer: Self-pay

## 2023-02-21 MED ORDER — DOXYCYCLINE HYCLATE 100 MG PO CAPS: 100.0000 mg | ORAL_CAPSULE | Freq: Two times a day (BID) | ORAL | 0 refills | Status: AC

## 2023-02-21 MED ORDER — METRONIDAZOLE 500 MG PO TABS: 500.0000 mg | ORAL_TABLET | Freq: Two times a day (BID) | ORAL | 0 refills | Status: DC

## 2023-02-21 MED ORDER — FLUCONAZOLE 150 MG PO TABS
150.0000 mg | ORAL_TABLET | Freq: Every day | ORAL | 0 refills | Status: AC
Start: 2023-02-21 — End: 2023-02-23

## 2023-02-21 NOTE — Telephone Encounter (Signed)
 See result note.

## 2023-04-30 ENCOUNTER — Ambulatory Visit

## 2023-05-02 ENCOUNTER — Other Ambulatory Visit (HOSPITAL_COMMUNITY)
Admission: RE | Admit: 2023-05-02 | Discharge: 2023-05-02 | Disposition: A | Discharge status: Home / Self Care | Source: Ambulatory Visit | Attending: Family Medicine | Admitting: Family Medicine

## 2023-05-02 ENCOUNTER — Ambulatory Visit (INDEPENDENT_AMBULATORY_CARE_PROVIDER_SITE_OTHER): Admitting: Student

## 2023-05-02 VITALS — BP 111/67 | HR 100 | Ht 64.0 in | Wt 206.2 lb

## 2023-05-02 DIAGNOSIS — Z01818 Encounter for other preprocedural examination: Secondary | ICD-10-CM | POA: Diagnosis present

## 2023-05-02 DIAGNOSIS — D649 Anemia, unspecified: Secondary | ICD-10-CM | POA: Diagnosis not present

## 2023-05-02 DIAGNOSIS — Z113 Encounter for screening for infections with a predominantly sexual mode of transmission: Secondary | ICD-10-CM | POA: Insufficient documentation

## 2023-05-02 DIAGNOSIS — J4520 Mild intermittent asthma, uncomplicated: Secondary | ICD-10-CM

## 2023-05-02 NOTE — Patient Instructions (Signed)
 I am getting blood work today.  I will let you know testing results for gonorrhea and chlamydia through MyChart.  Please send the required documents to our office and we will have that filled out and faxed to your plastic surgeon.  I have ordered a chest x-ray to [address].  Is at [facility].  You do not need an appointment and you can walk-in and get this done.  Once I have the results I will fax that over to the office.

## 2023-05-02 NOTE — Progress Notes (Signed)
 Surgical Pre-operative Evaluation:  Procedure: Lipo 360 and fat transfer to buttocks  Procedural risk: intermediate    Anesthesia: general  PMH: Asthma, eczema. Not currently having any problems.   Asthma: Not requiring albuterol daily.  She is well-controlled.  No symptoms of wheezing, shortness of breath, difficulty breathing.    Eczema: Uses steroid creams intermittently.  Does not currently have an eczema flare.  Well-controlled.  Surgical History:  No past surgeries.   The patient denies any history of spinal surgeries.  Family History: The patient denies any family history of complications with anesthesia and VTE.  Social History: Tobacco Use: Recently quit over 2 months ago. Alcohol Use: denies Other substance use: denies   Screening for OSA: STOP BANG score of 1, low risk of OSA  Medication Adjustments: Hold steroid creams day of surgery.   Risk Stratification Tool: Chales Abrahams Calculator: 0.0 % risk of MI or cardiac event, intraoperatively or up to 30 days post-op  Functional Capacity:  Normal   Well-appearing, no acute distress Cardio: Regular rate, regular rhythm, no murmurs on exam. Pulm: Clear, no wheezing, no crackles. No increased work of breathing Abdominal: bowel sounds present, soft, non-tender, non-distended Extremities: no peripheral edema  Neuro: alert and oriented x3, speech normal in content, no facial asymmetry, strength intact and equal bilaterally in UE and LE, pupils equal and reactive to light.  Skin: Mild well-controlled eczema noted on ankles, backs of knees.  Well-controlled without signs of secondary infection or acute flare.  Labs to Check: Surgeon requesting screening labs for CBC with differential, CMP, INR/PT, PTT, hCG quantitative, HIV  CXR: Required by plastic surgeon  Final Assessment:  The patient is at low risk of complications from a moderate risk surgery.  I recommend the following additional tests: RPR for STI testing  I  recommend the following changes to medications in the perioperative setting: Hold steroid cream day of surgery.  STI Screening collected today. Will message patient with results.

## 2023-05-03 LAB — BETA HCG QUANT (REF LAB): hCG Quant: <1 m[IU]/mL

## 2023-05-05 ENCOUNTER — Encounter: Payer: Self-pay | Admitting: Student

## 2023-05-05 ENCOUNTER — Telehealth: Payer: Self-pay | Admitting: Student

## 2023-05-05 LAB — CERVICOVAGINAL ANCILLARY ONLY
Chlamydia: POSITIVE — AB
Comment: NEGATIVE
Comment: NORMAL
Neisseria Gonorrhea: POSITIVE — AB

## 2023-05-05 NOTE — Telephone Encounter (Signed)
 Called patient to discuss positive RPR results indication syphilis infection. HIPAA compliant voicemail left.   Patient will need treatment with penicillin. Forwarded chart to [facility] RN to notify HD.   Glendale Chard, DO [facility], PGY-2 05/05/23 8:53 AM

## 2023-05-06 ENCOUNTER — Telehealth: Payer: Self-pay | Admitting: Student

## 2023-05-06 LAB — COMPREHENSIVE METABOLIC PANEL
ALT: 7 IU/L (ref 0–32)
AST: 16 IU/L (ref 0–40)
Albumin: 4.3 g/dL (ref 4.0–5.0)
Alkaline Phosphatase: 60 IU/L (ref 44–121)
BUN/Creatinine Ratio: 18 (ref 9–23)
BUN: 12 mg/dL (ref 6–20)
Bilirubin Total: 0.4 mg/dL (ref 0.0–1.2)
CO2: 23 mmol/L (ref 20–29)
Calcium: 9 mg/dL (ref 8.7–10.2)
Chloride: 103 mmol/L (ref 96–106)
Creatinine, Ser: 0.65 mg/dL (ref 0.57–1.00)
Globulin, Total: 3.2 g/dL (ref 1.5–4.5)
Glucose: 79 mg/dL (ref 70–99)
Potassium: 4 mmol/L (ref 3.5–5.2)
Sodium: 139 mmol/L (ref 134–144)
Total Protein: 7.5 g/dL (ref 6.0–8.5)
eGFR: 127 mL/min/{1.73_m2} (ref >59)

## 2023-05-06 LAB — CBC WITH DIFFERENTIAL/PLATELET
Basophils Absolute: 0 10*3/uL (ref 0.0–0.2)
Basos: 0 %
EOS (ABSOLUTE): 0.2 10*3/uL (ref 0.0–0.4)
Eos: 4 %
Hematocrit: 37.7 % (ref 34.0–46.6)
Hemoglobin: 12.3 g/dL (ref 11.1–15.9)
Immature Grans (Abs): 0 10*3/uL (ref 0.0–0.1)
Immature Granulocytes: 0 %
Lymphocytes Absolute: 1.1 10*3/uL (ref 0.7–3.1)
Lymphs: 25 %
MCH: 28.7 pg (ref 26.6–33.0)
MCHC: 32.6 g/dL (ref 31.5–35.7)
MCV: 88 fL (ref 79–97)
Monocytes Absolute: 0.4 10*3/uL (ref 0.1–0.9)
Monocytes: 9 %
Neutrophils Absolute: 2.7 10*3/uL (ref 1.4–7.0)
Neutrophils: 62 %
Platelets: 295 10*3/uL (ref 150–450)
RBC: 4.28 x10E6/uL (ref 3.77–5.28)
RDW: 15.3 % (ref 11.7–15.4)
WBC: 4.3 10*3/uL (ref 3.4–10.8)

## 2023-05-06 LAB — RPR, QUANT+TP ABS (REFLEX): Rapid Plasma Reagin, Quant: 1:16 {titer} — ABNORMAL HIGH

## 2023-05-06 LAB — RPR: RPR Ser Ql: REACTIVE — AB

## 2023-05-06 LAB — APTT: aPTT: 27 s (ref 24–33)

## 2023-05-06 LAB — PROTIME-INR
INR: 1 (ref 0.9–1.2)
Prothrombin Time: 11 s (ref 9.1–12.0)

## 2023-05-06 LAB — HIV ANTIBODY (ROUTINE TESTING W REFLEX): HIV Screen 4th Generation wRfx: NONREACTIVE

## 2023-05-06 NOTE — Telephone Encounter (Signed)
 Called patient to discuss positive syphilis, chlamydia, gonorrhea.  Patient did not answer the phone and HIPAA compliant voicemail left.  If she does not call back the office by this afternoon we will call her mother who is also a contact on her list.  Patient will need a nurse visit for treatment for syphilis, gonorrhea and chlamydia.  Glendale Chard, DO [facility], PGY-2 05/06/23 8:24 AM

## 2023-05-07 ENCOUNTER — Telehealth: Payer: Self-pay | Admitting: Student

## 2023-05-07 DIAGNOSIS — A749 Chlamydial infection, unspecified: Secondary | ICD-10-CM

## 2023-05-07 MED ORDER — DOXYCYCLINE HYCLATE 100 MG PO TABS: 100.0000 mg | ORAL_TABLET | Freq: Two times a day (BID) | ORAL | 0 refills | Status: AC

## 2023-05-07 NOTE — Telephone Encounter (Signed)
 Called patient to discuss positive STI results.  She is scheduled for Monday to come in for shot of penicillin and ceftriaxone.  Prescription of a 7-day course doxycycline sent in to her pharmacy.  Discussed with her expedited partner treatment.  Patient declined and said that she would notify her partner herself for him to get treatment.  Instructed patient that her partner can go to the health department to get treatment.  Glendale Chard, DO [facility], PGY-2 05/07/23 9:21 AM

## 2023-05-07 NOTE — Telephone Encounter (Signed)
 Called patient.  Left HIPAA compliant voicemail.  Called patient's mother.  Confirmed patient's cell phone number with her.  Mother had been in contact with the patient yesterday.  Asked mother to have patient call the office to discuss lab results.  Glendale Chard, DO [facility], PGY-2 05/07/23 9:01 AM

## 2023-05-12 ENCOUNTER — Ambulatory Visit

## 2023-05-16 NOTE — Telephone Encounter (Signed)
 Patient rescheduled nurse visit for Monday.   Dr. Hyacinth Meeker please indicate dosing for both penicillin and ceftriaxone. Also, is penicillin one time dose or does she need weekly for 3 weeks?  Veronda Prude, RN

## 2023-05-19 ENCOUNTER — Ambulatory Visit

## 2023-05-26 ENCOUNTER — Ambulatory Visit

## 2023-05-30 ENCOUNTER — Telehealth: Payer: Self-pay | Admitting: Student

## 2023-05-30 NOTE — Telephone Encounter (Signed)
 Patient calling regarding transfer of her medical records. Patient states 'Glendale Chard told me to call back and get her phone number or email so we can transfer my records.' I told patient we do not give out doctors personal information like phone number and email. I offered a fax number and patient was not okay with that. Patient asked that Glendale Chard give her a call.

## 2023-06-02 ENCOUNTER — Ambulatory Visit

## 2023-06-02 DIAGNOSIS — A539 Syphilis, unspecified: Secondary | ICD-10-CM

## 2023-06-02 DIAGNOSIS — A549 Gonococcal infection, unspecified: Secondary | ICD-10-CM

## 2023-06-02 MED ORDER — PENICILLIN G BENZATHINE 1200000 UNIT/2ML IM SUSY
1.2000 10*6.[IU] | PREFILLED_SYRINGE | Freq: Once | INTRAMUSCULAR | Status: AC
  Administered 2023-06-02: 1.2 10*6.[IU] via INTRAMUSCULAR

## 2023-06-02 MED ORDER — CEFTRIAXONE SODIUM 250 MG IJ SOLR
250.0000 mg | Freq: Once | INTRAMUSCULAR | Status: AC
  Administered 2023-06-02: 250 mg via INTRAMUSCULAR

## 2023-06-02 NOTE — Progress Notes (Signed)
 Patient presents to nurse clinic for STD treatment of Gonorrhea and Syphilis.   Patient advised to abstain from sex for 7-10 days after treatment of self and partner.    Ceftriaxone 250 mg IM x 1 given in LUOQ and Ceftriaxone 250mg  IM x 1 given in RUOQ per Dr. Kassie Pais orders.   Penicillin 1.2 million units given in LUOQ and Penicillin 1.2 million units given in RUOQ.  Patient observed 15 minutes in office.  No reaction noted.   Patient to follow up in 2-3 months for re-screening.    Provided condoms and advised to use with all sexual activity. Patient verbalized understanding.   STD report form fax completed and faxed to [facility] Department at [phone] (STD department).

## 2023-06-02 NOTE — Telephone Encounter (Signed)
 Patient treated today in nurse clinic for Gonorrhea and Syphilis.   She reports she will be picking up the antibiotic as well to treat Chlamydia. I stressed the importance of starting this as soon as possible.   She reports she made an apt with PCP to fulfill remainder of requirements for pre op.   No further action needed from [place].

## 2023-06-05 ENCOUNTER — Ambulatory Visit: Admitting: Student

## 2023-06-17 ENCOUNTER — Ambulatory Visit: Admitting: Student

## 2023-06-26 ENCOUNTER — Telehealth: Payer: Self-pay | Admitting: *Deleted

## 2023-06-26 NOTE — Telephone Encounter (Signed)
 Pt is calling for 2 reasons:  She reports being told at her last appt that a "scan of my heart was not needed".  She says that her surgeon will not do the surgery without it.  Since it has been over a month her Hgb is no longer valid, she will need this done again.  She will need a new form filled out, she is bringing it with her.  She is requesting to have the heart scan no more than 1-2 days after her appt to avoid another delay in getting her surgery.    She has an appt with Dr. Mikeal Alder on Wednesday.  Macario Savin, CMA

## 2023-07-02 ENCOUNTER — Ambulatory Visit: Admitting: Student

## 2023-07-11 ENCOUNTER — Ambulatory Visit

## 2023-07-17 ENCOUNTER — Ambulatory Visit (HOSPITAL_COMMUNITY): Payer: Self-pay

## 2023-07-17 ENCOUNTER — Ambulatory Visit (HOSPITAL_COMMUNITY)
Admission: EM | Admit: 2023-07-17 | Discharge: 2023-07-17 | Disposition: A | Discharge status: Home / Self Care | Attending: Family Medicine | Admitting: Family Medicine

## 2023-07-17 ENCOUNTER — Encounter (HOSPITAL_COMMUNITY): Payer: Self-pay | Admitting: Family Medicine

## 2023-07-17 DIAGNOSIS — H6122 Impacted cerumen, left ear: Secondary | ICD-10-CM | POA: Insufficient documentation

## 2023-07-17 DIAGNOSIS — B2790 Infectious mononucleosis, unspecified without complication: Secondary | ICD-10-CM | POA: Insufficient documentation

## 2023-07-17 DIAGNOSIS — Z202 Contact with and (suspected) exposure to infections with a predominantly sexual mode of transmission: Secondary | ICD-10-CM | POA: Diagnosis not present

## 2023-07-17 DIAGNOSIS — N898 Other specified noninflammatory disorders of vagina: Secondary | ICD-10-CM | POA: Diagnosis present

## 2023-07-17 DIAGNOSIS — R590 Localized enlarged lymph nodes: Secondary | ICD-10-CM | POA: Insufficient documentation

## 2023-07-17 LAB — HIV ANTIBODY (ROUTINE TESTING W REFLEX): HIV Screen 4th Generation wRfx: NONREACTIVE

## 2023-07-17 LAB — RPR
RPR Ser Ql: REACTIVE — AB
RPR Titer: 1:1 {titer}

## 2023-07-17 LAB — POCT MONO SCREEN (KUC): Mono, POC: POSITIVE — AB

## 2023-07-17 MED ORDER — CARBAMIDE PEROXIDE 6.5 % OT SOLN: 5.0000 [drp] | Freq: Two times a day (BID) | OTIC | 0 refills | Status: AC

## 2023-07-17 NOTE — Discharge Instructions (Addendum)
 We Will call you with the results of your lab work.

## 2023-07-17 NOTE — ED Provider Notes (Signed)
 [facility]    CSN: [id number] Arrival date & time: 07/17/23  9629      History   Chief Complaint Chief Complaint  Patient presents with   SEXUALLY TRANSMITTED DISEASE   Cyst    HPI Heather [name] is a 23 y.o. female.   The patient came in for regular STD testing.  She has had some white vaginal discharge but no dysuria, pelvic pain, abdominal pain, urinary, rashes, joint pain.  Her last menstrual period ended on Monday and she has not been sexually active since then.  In previously for some swelling in the back of her neck on the right.  This has markedly decreased over time but still present.  She was treated previously for tinea capitis and completed this treatment.  It is intermittently painful but she has not had any recent illnesses, fever, chills, scalp lesions, congestion, ear pain, neck stiffness or pain.  The history is provided by the patient.    Past Medical History:  Diagnosis Date   Abnormal antenatal AFP screen 07/14/2019   2.35 MOM (high normal)  Normal ultrasound  Attempted to reach multiple times--Dr. Elisha Guillaume to discuss at follow up. Discussed.      Allergy    Anemia, iron  deficiency    Asthma    last used inhaler 02/11/18   Eczema     Patient Active Problem List   Diagnosis Date Noted   Acute on chronic anemia 12/16/2019   Allergic rhinitis 12/07/2018   Asthma 04/17/2006   Eczema 04/17/2006    Past Surgical History:  Procedure Laterality Date   NO PAST SURGERIES      OB History     Gravida  2   Para  2   Term  2   Preterm  0   AB  0   Living  2      SAB  0   IAB  0   Ectopic  0   Multiple  0   Live Births  2            Home Medications    Prior to Admission medications   Medication Sig Start Date End Date Taking? Authorizing Provider  carbamide peroxide (DEBROX) 6.5 % OTIC solution Place 5 drops into the left ear 2 (two) times daily. 07/17/23  Yes Claybon Cuna, MD  albuterol  (VENTOLIN  HFA)  108 (90 Base) MCG/ACT inhaler Inhale 2-4 puffs into the lungs every 4 (four) hours as needed for wheezing (or cough). 03/22/22   Ernestina Headland, MD  Ferrous Sulfate  Dried (FERROUS SULFATE  IRON ) 200 (65 Fe) MG TABS Take 1 tablet by mouth 2 (two) times daily. Patient not taking: Reported on 07/17/2023 11/04/19   Brimage, Vondra, DO  fluticasone  (FLONASE ) 50 MCG/ACT nasal spray Place 1-2 sprays into both nostrils daily as needed for allergies. 02/22/22   Ernestina Headland, MD  triamcinolone  cream (KENALOG ) 0.1 % Apply 1 Application topically daily. 02/22/22   Ernestina Headland, MD    Family History Family History  Problem Relation Age of Onset   Hypertension Mother    Diabetes Maternal Grandmother    COPD Maternal Grandmother     Social History Social History   Tobacco Use   Smoking status: Never   Smokeless tobacco: Never  Vaping Use   Vaping status: Never Used  Substance Use Topics   Alcohol use: Not Currently   Drug use: Not Currently     Allergies   Dust mite extract, Pollen extract, and Shellfish allergy  Review of Systems Review of Systems  Constitutional:  Negative for activity change, appetite change, chills, diaphoresis, fatigue, fever and unexpected weight change.  HENT:  Negative for congestion, ear pain, rhinorrhea, sinus pressure, sinus pain, sore throat and trouble swallowing.   Gastrointestinal:  Negative for diarrhea, nausea and vomiting.  Genitourinary:  Positive for vaginal discharge. Negative for decreased urine volume, difficulty urinating, dyspareunia, dysuria, flank pain, frequency, genital sores, hematuria, menstrual problem, pelvic pain, urgency, vaginal bleeding and vaginal pain.  Musculoskeletal:  Negative for arthralgias, joint swelling, myalgias, neck pain and neck stiffness.  Skin:  Negative for color change and rash.  Neurological:  Negative for dizziness, weakness, light-headedness, numbness and headaches.  Hematological:  Negative for adenopathy.      Physical Exam Triage Vital Signs ED Triage Vitals  Encounter Vitals Group     BP      Systolic BP Percentile      Diastolic BP Percentile      Pulse      Resp      Temp      Temp src      SpO2      Weight      Height      Head Circumference      Peak Flow      Pain Score      Pain Loc      Pain Education      Exclude from Growth Chart    No data found.  Updated Vital Signs BP (!) 147/80 (BP Location: Right Arm)   Pulse 68   Temp 98.9 F (37.2 C) (Oral)   Resp 18   LMP 07/03/2023 (Approximate)   SpO2 98%   Visual Acuity Right Eye Distance:   Left Eye Distance:   Bilateral Distance:    Right Eye Near:   Left Eye Near:    Bilateral Near:     Physical Exam Vitals reviewed.  Constitutional:      General: She is not in acute distress.    Appearance: Normal appearance. She is not ill-appearing, toxic-appearing or diaphoretic.  HENT:     Head: Normocephalic and atraumatic.     Right Ear: Tympanic membrane normal.     Left Ear: Tympanic membrane normal.     Nose: Nose normal.     Mouth/Throat:     Mouth: Mucous membranes are moist.     Pharynx: Oropharynx is clear. No oropharyngeal exudate or posterior oropharyngeal erythema.  Eyes:     General: No scleral icterus.       Right eye: No discharge.        Left eye: No discharge.     Extraocular Movements: Extraocular movements intact.     Conjunctiva/sclera: Conjunctivae normal.     Pupils: Pupils are equal, round, and reactive to light.  Cardiovascular:     Rate and Rhythm: Normal rate and regular rhythm.     Pulses: Normal pulses.     Heart sounds: Normal heart sounds.  Pulmonary:     Effort: Pulmonary effort is normal.  Abdominal:     General: Abdomen is flat.     Tenderness: There is no abdominal tenderness.  Musculoskeletal:     Cervical back: Normal range of motion and neck supple. No rigidity or tenderness.  Lymphadenopathy:     Cervical: Cervical adenopathy (On the right) present.  Skin:     Capillary Refill: Capillary refill takes 2 to 3 seconds.     Coloration: Skin is not jaundiced.  Findings: No rash.  Neurological:     General: No focal deficit present.     Mental Status: She is alert.  Psychiatric:        Mood and Affect: Mood normal.      UC Treatments / Results  Labs (all labs ordered are listed, but only abnormal results are displayed) Labs Reviewed  POCT MONO SCREEN ([facility]) - Abnormal; Notable for the following components:      Result Value   Mono, POC Positive (*)    All other components within normal limits  HIV ANTIBODY (ROUTINE TESTING W REFLEX)  RPR  CERVICOVAGINAL ANCILLARY ONLY    EKG   Radiology No results found.  Procedures Procedures (including critical care time)  Medications Ordered in UC Medications - No data to display  Initial Impression / Assessment and Plan / UC Course  I have reviewed the triage vital signs and the nursing notes.  Pertinent labs & imaging results that were available during my care of the patient were reviewed by me and considered in my medical decision making (see chart for details).     Infectious mononucleosis -Given the patient's history of ongoing swelling in the back of her neck, lack of recent illness or other sources of drainage in the head we decided to test for. - Monospot test positive - The patient was called with results and advised on symptom management and avoidance of any contact sports, horse riding, and avoiding exposure to others. - The patient voiced understanding and agreement with the plan.   Possible STD exposure with vaginal discharge - HIV, RPR, and vaginal swab pending.  She is currently asymptomatic aside from some light vaginal discharge.  Will Red flag symptoms.  Pregnancy test declined. -I discussed safe sex practices with the patient - Will call her with lab results.    Final Clinical Impressions(s) / UC Diagnoses   Final diagnoses:  LAD (lymphadenopathy), posterior  cervical  Possible exposure to STD  Vaginal discharge  Cerumen debris on tympanic membrane of left ear  Infectious mononucleosis without complication, infectious mononucleosis due to unspecified organism     Discharge Instructions      We Will call you with the results of your lab work.   ED Prescriptions     Medication Sig Dispense Auth. Provider   carbamide peroxide (DEBROX) 6.5 % OTIC solution Place 5 drops into the left ear 2 (two) times daily. 15 mL Claybon Cuna, MD      PDMP not reviewed this encounter.   Claybon Cuna, MD 07/17/23 1116

## 2023-07-17 NOTE — ED Triage Notes (Signed)
 Pt requesting routine STD testing with blood work. Denies sx's.  Pt c/o knot to back of neck for over a month. Seen and tx'd for same here a month ago.

## 2023-07-18 LAB — CERVICOVAGINAL ANCILLARY ONLY
Chlamydia: NEGATIVE
Comment: NEGATIVE
Comment: NEGATIVE
Comment: NORMAL
Neisseria Gonorrhea: POSITIVE — AB
Trichomonas: POSITIVE — AB

## 2023-07-22 LAB — T.PALLIDUM AB, TOTAL: T Pallidum Abs: NONREACTIVE

## 2023-07-30 ENCOUNTER — Encounter (HOSPITAL_COMMUNITY): Payer: Self-pay

## 2023-07-30 ENCOUNTER — Ambulatory Visit (HOSPITAL_COMMUNITY)
Admission: EM | Admit: 2023-07-30 | Discharge: 2023-07-30 | Disposition: A | Discharge status: Home / Self Care | Attending: Internal Medicine | Admitting: Internal Medicine

## 2023-07-30 DIAGNOSIS — J4521 Mild intermittent asthma with (acute) exacerbation: Secondary | ICD-10-CM | POA: Diagnosis not present

## 2023-07-30 DIAGNOSIS — H60393 Other infective otitis externa, bilateral: Secondary | ICD-10-CM | POA: Diagnosis not present

## 2023-07-30 DIAGNOSIS — H6123 Impacted cerumen, bilateral: Secondary | ICD-10-CM | POA: Diagnosis not present

## 2023-07-30 LAB — POC SARS CORONAVIRUS 2 AG -  ED: SARS Coronavirus 2 Ag: NEGATIVE

## 2023-07-30 MED ORDER — OFLOXACIN 0.3 % OT SOLN: 10.0000 [drp] | Freq: Two times a day (BID) | OTIC | 0 refills | Status: AC

## 2023-07-30 MED ORDER — ALBUTEROL SULFATE HFA 108 (90 BASE) MCG/ACT IN AERS: 1.0000 | INHALATION_SPRAY | Freq: Four times a day (QID) | RESPIRATORY_TRACT | 2 refills | Status: DC | PRN

## 2023-07-30 MED ORDER — PREDNISONE 20 MG PO TABS: 40.0000 mg | ORAL_TABLET | Freq: Every day | ORAL | 0 refills | Status: AC

## 2023-07-30 MED ORDER — PROMETHAZINE-DM 6.25-15 MG/5ML PO SYRP: 5.0000 mL | ORAL_SOLUTION | Freq: Every evening | ORAL | 0 refills | Status: DC | PRN

## 2023-07-30 NOTE — ED Triage Notes (Signed)
 Pt present with fatigue, sneezing, cough and sore throat X  three days.  Has a hx of seasonal allergies. C/o bilateral ear fullness and pain that started two days ago.   Home interventions: benadryl 

## 2023-07-30 NOTE — Discharge Instructions (Signed)
 Your symptoms are due to asthma attack. - Use albuterol  every 4-6 hours as needed for cough, shortness of breath, and wheeze. - Take the steroid sent to the pharmacy as directed to help reduce lung inflammation and decrease the risk of another attack in the next few days. No NSAIDs like ibuprofen  or aleve/naproxen while taking steroid. Take with food to avoid stomach upset. - Take prescribed cough medicine as directed.  If your symptoms do not improve in the next 2-3 days with interventions, please return. Please seek medical care for new or returning symptoms, such as difficulty breathing that doesn't improve with your medications, chest pain, voice changes, high fevers, confusion, or other new or worsening symptoms. Follow-up with PCP for ongoing management of asthma.  You have an ear infection of the ear canal known as otitis externa. Use ear drops as prescribed for 7 days. Do not place anything smaller than elbow deep into ear canal- this includes Q-tips. You may place a small amount of rubbing alcohol onto the end of a Q-tip and place this into the outer ear canal to dry up any remaining water that may have gotten into the ear while showering or submerging head underwater to prevent this type of infection in the future.

## 2023-07-30 NOTE — ED Provider Notes (Signed)
 [facility]    CSN: [id number] Arrival date & time: 07/30/23  0809      History   Chief Complaint Chief Complaint  Patient presents with   Sore Throat   Ear Pain    HPI Heather [name] is a 23 y.o. female.   Heather [name] is a 23 y.o. female presenting for chief complaint of sore throat, cough, generalized fatigue, wheezing, and bilateral ear pain that started 3 days ago.  Cough is mostly dry, nonproductive, and with wheezing heard initially when symptoms started.  She states wheezing has improved with use of albuterol  inhaler.  She has a history of asthma and states she rarely has to use her rescue inhaler.  She denies current shortness of breath and chest pain.  She suspects her bilateral ear fullness and decreased hearing is due to cerumen impaction.  She has been seen for this in the past and has been using over-the-counter eardrops to relieve cerumen impaction without relief.  Denies recent fever, chills, nausea, vomiting, diarrhea, abdominal pain, rash, dizziness, and recent sick contacts with similar symptoms.  Denies recent antibiotic or steroid use.  Never smoker, denies drug use.  Taking over-the-counter medications with minimal relief.   Sore Throat    Past Medical History:  Diagnosis Date   Abnormal antenatal AFP screen 07/14/2019   2.35 MOM (high normal)  Normal ultrasound  Attempted to reach multiple times--Dr. Elisha Guillaume to discuss at follow up. Discussed.      Allergy    Anemia, iron  deficiency    Asthma    last used inhaler 02/11/18   Eczema     Patient Active Problem List   Diagnosis Date Noted   Acute on chronic anemia 12/16/2019   Allergic rhinitis 12/07/2018   Asthma 04/17/2006   Eczema 04/17/2006    Past Surgical History:  Procedure Laterality Date   NO PAST SURGERIES      OB History     Gravida  2   Para  2   Term  2   Preterm  0   AB  0   Living  2      SAB  0   IAB  0   Ectopic  0   Multiple  0   Live  Births  2            Home Medications    Prior to Admission medications   Medication Sig Start Date End Date Taking? Authorizing Provider  ofloxacin  (FLOXIN ) 0.3 % OTIC solution Place 10 drops into both ears 2 (two) times daily for 7 days. 07/30/23 08/06/23 Yes Naevia Unterreiner, Danny Dye, FNP  predniSONE (DELTASONE) 20 MG tablet Take 2 tablets (40 mg total) by mouth daily with breakfast for 5 days. 07/30/23 08/04/23 Yes Starlene Eaton, FNP  promethazine-dextromethorphan (PROMETHAZINE-DM) 6.25-15 MG/5ML syrup Take 5 mLs by mouth at bedtime as needed for cough. 07/30/23  Yes Starlene Eaton, FNP  albuterol  (VENTOLIN  HFA) 108 (90 Base) MCG/ACT inhaler Inhale 1-2 puffs into the lungs every 6 (six) hours as needed for wheezing (or cough). 07/30/23   Starlene Eaton, FNP  carbamide peroxide (DEBROX) 6.5 % OTIC solution Place 5 drops into the left ear 2 (two) times daily. 07/17/23   Claybon Cuna, MD  Ferrous Sulfate  Dried (FERROUS SULFATE  IRON ) 200 (65 Fe) MG TABS Take 1 tablet by mouth 2 (two) times daily. Patient not taking: Reported on 07/17/2023 11/04/19   Brimage, Vondra, DO  fluticasone  (FLONASE ) 50 MCG/ACT nasal spray  Place 1-2 sprays into both nostrils daily as needed for allergies. 02/22/22   Ernestina Headland, MD  triamcinolone  cream (KENALOG ) 0.1 % Apply 1 Application topically daily. 02/22/22   Ernestina Headland, MD    Family History Family History  Problem Relation Age of Onset   Hypertension Mother    Diabetes Maternal Grandmother    COPD Maternal Grandmother     Social History Social History   Tobacco Use   Smoking status: Never   Smokeless tobacco: Never  Vaping Use   Vaping status: Never Used  Substance Use Topics   Alcohol use: Not Currently   Drug use: Not Currently     Allergies   Dust mite extract, Pollen extract, and Shellfish allergy   Review of Systems Review of Systems Per HPI  Physical Exam Triage Vital Signs ED Triage Vitals  Encounter  Vitals Group     BP 07/30/23 0836 113/85     Systolic BP Percentile --      Diastolic BP Percentile --      Pulse Rate 07/30/23 0836 79     Resp 07/30/23 0836 18     Temp 07/30/23 0836 98.1 F (36.7 C)     Temp Source 07/30/23 0836 Oral     SpO2 07/30/23 0836 98 %     Weight --      Height --      Head Circumference --      Peak Flow --      Pain Score 07/30/23 0835 4     Pain Loc --      Pain Education --      Exclude from Growth Chart --    No data found.  Updated Vital Signs BP 113/85 (BP Location: Left Arm)   Pulse 79   Temp 98.1 F (36.7 C) (Oral)   Resp 18   LMP 07/29/2023 (Approximate)   SpO2 98%   Visual Acuity Right Eye Distance:   Left Eye Distance:   Bilateral Distance:    Right Eye Near:   Left Eye Near:    Bilateral Near:     Physical Exam Vitals and nursing note reviewed.  Constitutional:      Appearance: She is not ill-appearing or toxic-appearing.  HENT:     Head: Normocephalic and atraumatic.     Right Ear: Hearing, ear canal and external ear normal. There is impacted cerumen.     Left Ear: Hearing, ear canal and external ear normal. There is impacted cerumen.     Nose: Congestion present.     Mouth/Throat:     Lips: Pink.     Mouth: Mucous membranes are moist. No injury or oral lesions.     Dentition: Normal dentition.     Tongue: No lesions.     Pharynx: Oropharynx is clear. Uvula midline. No pharyngeal swelling, oropharyngeal exudate, posterior oropharyngeal erythema, uvula swelling or postnasal drip.     Tonsils: No tonsillar exudate.  Eyes:     General: Lids are normal. Vision grossly intact. Gaze aligned appropriately.     Extraocular Movements: Extraocular movements intact.     Conjunctiva/sclera: Conjunctivae normal.  Neck:     Trachea: Trachea and phonation normal.  Cardiovascular:     Rate and Rhythm: Normal rate and regular rhythm.     Heart sounds: Normal heart sounds, S1 normal and S2 normal.  Pulmonary:     Effort:  Pulmonary effort is normal. No respiratory distress.     Breath sounds: Normal breath sounds and  air entry. No wheezing, rhonchi or rales.     Comments: Coarse breath sounds throughout.  Speaking in full sentences without difficulty. Chest:     Chest wall: No tenderness.  Musculoskeletal:     Cervical back: Neck supple.     Right lower leg: No edema.     Left lower leg: No edema.  Lymphadenopathy:     Cervical: No cervical adenopathy.  Skin:    General: Skin is warm and dry.     Capillary Refill: Capillary refill takes less than 2 seconds.     Findings: No rash.  Neurological:     General: No focal deficit present.     Mental Status: She is alert and oriented to person, place, and time. Mental status is at baseline.     Cranial Nerves: No dysarthria or facial asymmetry.  Psychiatric:        Mood and Affect: Mood normal.        Speech: Speech normal.        Behavior: Behavior normal.        Thought Content: Thought content normal.        Judgment: Judgment normal.      UC Treatments / Results  Labs (all labs ordered are listed, but only abnormal results are displayed) Labs Reviewed  POC SARS CORONAVIRUS 2 AG -  ED    EKG   Radiology No results found.  Procedures Procedures (including critical care time)  Medications Ordered in UC Medications - No data to display  Initial Impression / Assessment and Plan / UC Course  I have reviewed the triage vital signs and the nursing notes.  Pertinent labs & imaging results that were available during my care of the patient were reviewed by me and considered in my medical decision making (see chart for details).   1.  Mild intermittent asthma with acute exacerbation Evaluation suggests asthma exacerbation likely triggered by viral URI.  Will treat with steroid, bronchodilator, cough suppressants for symptomatic relief, and expectorants (mucinex) as needed.  Imaging: vitals stable, physical exam findings reassuring, therefore  deferred imaging of the chest.  Strep/viral testing: Point-of-care COVID is negative. Discussed PCP follow-up to discuss asthma action plan to prevent future asthma exacerbations.  2. Infective otitis externa of both ears, bilateral impacted cerumen Cerumen removed by nursing staff via ear lavage.  On reassessment, copious thick purulent material to the bilateral ear canals. Presentation consistent with otitis externa. Will manage this with ofloxacin  otic drops as prescribed for 7 days since I am unable to see the eardrum of the affected ear. Encouraged to avoid getting water into affected ear(s) for at least 7-10 days. Cotton ball to the external ear during showers to avoid water into the ear canal. Over the counter medications as needed for pain.    Counseled patient on potential for adverse effects with medications prescribed/recommended today, strict ER and return-to-clinic precautions discussed, patient verbalized understanding.    Final Clinical Impressions(s) / UC Diagnoses   Final diagnoses:  Mild intermittent asthma with (acute) exacerbation  Infective otitis externa of both ears  Bilateral impacted cerumen     Discharge Instructions      Your symptoms are due to asthma attack. - Use albuterol  every 4-6 hours as needed for cough, shortness of breath, and wheeze. - Take the steroid sent to the pharmacy as directed to help reduce lung inflammation and decrease the risk of another attack in the next few days. No NSAIDs like ibuprofen  or aleve/naproxen while  taking steroid. Take with food to avoid stomach upset. - Take prescribed cough medicine as directed.  If your symptoms do not improve in the next 2-3 days with interventions, please return. Please seek medical care for new or returning symptoms, such as difficulty breathing that doesn't improve with your medications, chest pain, voice changes, high fevers, confusion, or other new or worsening symptoms. Follow-up with PCP for  ongoing management of asthma.  You have an ear infection of the ear canal known as otitis externa. Use ear drops as prescribed for 7 days. Do not place anything smaller than elbow deep into ear canal- this includes Q-tips. You may place a small amount of rubbing alcohol onto the end of a Q-tip and place this into the outer ear canal to dry up any remaining water that may have gotten into the ear while showering or submerging head underwater to prevent this type of infection in the future.     ED Prescriptions     Medication Sig Dispense Auth. Provider   predniSONE (DELTASONE) 20 MG tablet Take 2 tablets (40 mg total) by mouth daily with breakfast for 5 days. 10 tablet Odena Mcquaid M, FNP   promethazine-dextromethorphan (PROMETHAZINE-DM) 6.25-15 MG/5ML syrup Take 5 mLs by mouth at bedtime as needed for cough. 118 mL Shella Devoid M, FNP   albuterol  (VENTOLIN  HFA) 108 (90 Base) MCG/ACT inhaler Inhale 1-2 puffs into the lungs every 6 (six) hours as needed for wheezing (or cough). 2 each Starlene Eaton, FNP   ofloxacin  (FLOXIN ) 0.3 % OTIC solution Place 10 drops into both ears 2 (two) times daily for 7 days. 5 mL Starlene Eaton, FNP      PDMP not reviewed this encounter.   Starlene Eaton, [place] 07/30/23 1015

## 2023-09-30 ENCOUNTER — Ambulatory Visit: Admitting: Student

## 2023-12-08 ENCOUNTER — Ambulatory Visit: Admitting: Student

## 2023-12-11 ENCOUNTER — Ambulatory Visit: Admitting: Student

## 2023-12-14 NOTE — Progress Notes (Deleted)
 Surgical Pre-operative Evaluation:  Procedure: *** Procedural risk: ***   low risk- ambulatory surgery outpatient, cataract, endoscopy, skin surgery, breast surgery High risk- peripheral vascular surgery, abdominal aorta surgery EVERYTHING ELSE IS INTERMEDIATE RISK (orthopedic surgery, head and neck procedures, hysterectomy/gyn surgeries, hernia repair, cholescystectomy)  Anesthesia: *** (general, spinal, regional block)  PMH: ***  Surgical History:  The patient denies*** any complication with anesthesia, bleeding, or post-operative confusion, nausea, or vomiting in prior surgical interventions. The patient denies any *** history of spinal surgeries.  Family History: The patient denies*** any family history of complications with anesthesia and VTE.  Social History: Tobacco Use: *** (Recommend smoking cessation anytime before surgery to improve wound healing, 4-8 weeks befter surgery reduces risk of respiratory complications) Alcohol Use: *** Other substance use: ***  Screening for OSA: STOP BANG score of ***, *** risk of OSA  Medication Adjustments: ***can delete and put your patient's specific medications*** - Generally hold ACEI/ARB, and diuretics on day of surgery - Generally continute steroids, amlodipine, BB, clonidine (risk of withdrawal associated with harm) - Continue statins, levothyroxine - SGLT2 inhibitors stopped 3-4 days prior to surgery, other oral and injectable non-insulin agents held on day of surgery - Insulin- reduce basal dose by 10-25% prior to surgery (either night before or morning of depending on how patient takes it) and hold prandial insulin on day of surgery - for DAPT- talk to cards! Earliest to possibly hold is 4-6 weeks after placement of BMS/DES - Antiplatelet- Plavix for a minimum of 5 days and continue aspirin if possible, prasugrel hold for 7 days, ticagrelor hold for 3 days - Aspirin - if for primary prevention hold, if for secondary prevention  continue if possible (if spinal surgery or high risk brain surgery hold for 5 days) - Anticoagulants:  - Consider bridging for patients on warfarin for mechanical mitral valve, mechanical aortic valve, high CHADS2VASC score- stop 5 days before surgery and proceed with surgery when INR < 1.5         - Apixaban- stop 24-36hrs before moderate risk surgery, 48+hrs before high risk surgery (depends on CrCl)  - Rivaroxaban - same as apixaban - Caribopda-levodopa- contiue through day of srugery as possible, attempt to scehdule first case of day (increase risk of confusion/rigidity when held) - SSRI/SNRI- consider holding if high risk procedure or aspirin therapy  Risk Stratification Tool: Chales Abrahams Calculator: *** % risk of MI or cardiac event, intraoperatively or up to 30 days post-op  Functional Capacity:  Walking 2 blocks = 2.75 METS  Climb 1 flight of stairs = 5.50 METS   Pushing the mower = 4.50 METS   Singles tennis = 8 METS   If patients can achieve > 4 METS- no stress test recommended (if they cannot, consider a stress test as this can change management)  Labs to Check: - CBC- if > 61 yo and undergoing intermediate or high risk surgery, or younger patient that may have significant blood loss - Renal function- if taking antihypertensives or meds that can affect renal function or diseases like T2DM that can affect renal function - A1c- reasonable to check for ages 43-70 who are overweight/obese, or known T2DM/prediabetes - Coagulation testing- if history of easy bleeding, known liver or renal disease, taking anticoagulants - BNP - if age  > 57yo, or age 49-64 with significant CV disease - Urinalysis- if undergoing implantation of prosthetic joint or valve, undergoing invasive urologic procedures  ECG: reasonable if known CV disease or other risk factors undergoing intermediate  or high risk surgery CXR: only for patients with new or unstable cardiopulmonary symptoms  Final  Assessment:  The patient is at low***intermediate***high*** risk of complications from a low***moderate***high*** risk surgery.  I recommend the following additional tests: I recommend the following changes to medications in the perioperative setting:

## 2023-12-15 ENCOUNTER — Ambulatory Visit: Admitting: Family Medicine

## 2023-12-29 ENCOUNTER — Encounter (HOSPITAL_COMMUNITY): Payer: Self-pay | Admitting: Emergency Medicine

## 2023-12-29 ENCOUNTER — Other Ambulatory Visit: Payer: Self-pay

## 2023-12-29 ENCOUNTER — Ambulatory Visit (HOSPITAL_COMMUNITY)
Admission: EM | Admit: 2023-12-29 | Discharge: 2023-12-29 | Disposition: A | Discharge status: Home / Self Care | Attending: Family Medicine | Admitting: Family Medicine

## 2023-12-29 DIAGNOSIS — J4521 Mild intermittent asthma with (acute) exacerbation: Secondary | ICD-10-CM | POA: Insufficient documentation

## 2023-12-29 DIAGNOSIS — Z202 Contact with and (suspected) exposure to infections with a predominantly sexual mode of transmission: Secondary | ICD-10-CM | POA: Insufficient documentation

## 2023-12-29 DIAGNOSIS — J069 Acute upper respiratory infection, unspecified: Secondary | ICD-10-CM | POA: Insufficient documentation

## 2023-12-29 DIAGNOSIS — H60501 Unspecified acute noninfective otitis externa, right ear: Secondary | ICD-10-CM | POA: Diagnosis not present

## 2023-12-29 DIAGNOSIS — H6691 Otitis media, unspecified, right ear: Secondary | ICD-10-CM | POA: Insufficient documentation

## 2023-12-29 LAB — POC SOFIA SARS ANTIGEN FIA: SARS Coronavirus 2 Ag: NEGATIVE

## 2023-12-29 MED ORDER — OFLOXACIN 0.3 % OT SOLN: 5.0000 [drp] | Freq: Two times a day (BID) | OTIC | 0 refills | Status: AC

## 2023-12-29 MED ORDER — CEFDINIR 300 MG PO CAPS: 600.0000 mg | ORAL_CAPSULE | Freq: Every day | ORAL | 0 refills | Status: AC

## 2023-12-29 MED ORDER — ALBUTEROL SULFATE HFA 108 (90 BASE) MCG/ACT IN AERS: 2.0000 | INHALATION_SPRAY | RESPIRATORY_TRACT | 0 refills | Status: AC | PRN

## 2023-12-29 MED ORDER — PREDNISONE 20 MG PO TABS: 40.0000 mg | ORAL_TABLET | Freq: Every day | ORAL | 0 refills | Status: AC

## 2023-12-29 NOTE — Discharge Instructions (Signed)
 Your COVID test is negative  -Floxin  eardrops-5 drops in the affected ear 2 times daily for 7 days. Take cefdinir 300 mg--2 capsules together daily for 7 days  Albuterol  inhaler--do 2 puffs every 4 hours as needed for shortness of breath or wheezing  Take prednisone  20 mg--2 daily for 5 days  Staff will notify you if there is anything positive on the swab or on the blood work. It can take 2-3 days for the tests to result, depending on the day of the week your test was taken. You will only be notified if there are any positives on the testing; test results will also go to your MyChart if you are signed up for MyChart.

## 2023-12-29 NOTE — ED Provider Notes (Signed)
 [facility]    CSN: [id number] Arrival date & time: 12/29/23  1134      History   Chief Complaint Chief Complaint  Patient presents with   Otalgia   Cough    HPI Heather [name] is a 23 y.o. female.    Otalgia Associated symptoms: cough   Cough Associated symptoms: ear pain   Here for cough and nasal congestion and right ear pain.  She has also had some increased wheezing with her history of asthma.  She notes right ear pain and some brown drainage from the right ear.  She did have some sore throat at first but none now.  Symptoms began about November 6.  She felt worse on November 7 with more malaise.  No nausea vomiting or diarrhea.  Last menstrual cycle was October 27. She is allergic to shellfish.  She would also like to be tested for sexually transmitted diseases.  She does not have any vaginal discharge or pelvic pain. Past Medical History:  Diagnosis Date   Abnormal antenatal AFP screen 07/14/2019   2.35 MOM (high normal)  Normal ultrasound  Attempted to reach multiple times--Dr. Adolph Lindau to discuss at follow up. Discussed.      Allergy    Anemia, iron  deficiency    Asthma    last used inhaler 02/11/18   Eczema     Patient Active Problem List   Diagnosis Date Noted   Acute on chronic anemia 12/16/2019   Allergic rhinitis 12/07/2018   Asthma 04/17/2006   Eczema 04/17/2006    Past Surgical History:  Procedure Laterality Date   NO PAST SURGERIES      OB History     Gravida  2   Para  2   Term  2   Preterm  0   AB  0   Living  2      SAB  0   IAB  0   Ectopic  0   Multiple  0   Live Births  2            Home Medications    Prior to Admission medications   Medication Sig Start Date End Date Taking? Authorizing Provider  albuterol  (VENTOLIN  HFA) 108 (90 Base) MCG/ACT inhaler Inhale 2 puffs into the lungs every 4 (four) hours as needed for wheezing or shortness of breath. 12/29/23  Yes Loriann Bosserman K, MD   carbamide peroxide (DEBROX) 6.5 % OTIC solution Place 5 drops into the left ear 2 (two) times daily. 07/17/23  Yes Janet Lonni BRAVO, MD  cefdinir (OMNICEF) 300 MG capsule Take 2 capsules (600 mg total) by mouth daily for 7 days. 12/29/23 01/05/24 Yes Vonna Sharlet POUR, MD  ofloxacin  (FLOXIN ) 0.3 % OTIC solution Place 5 drops into both ears 2 (two) times daily for 7 days. 12/29/23 01/05/24 Yes Vonna Sharlet POUR, MD  predniSONE  (DELTASONE ) 20 MG tablet Take 2 tablets (40 mg total) by mouth daily with breakfast for 5 days. 12/29/23 01/03/24 Yes Dorenda Pfannenstiel, Sharlet POUR, MD  Ferrous Sulfate  Dried (FERROUS SULFATE  IRON ) 200 (65 Fe) MG TABS Take 1 tablet by mouth 2 (two) times daily. Patient not taking: Reported on 07/17/2023 11/04/19   Brimage, Vondra, DO  fluticasone  (FLONASE ) 50 MCG/ACT nasal spray Place 1-2 sprays into both nostrils daily as needed for allergies. 02/22/22   Bryan Bianchi, MD  triamcinolone  cream (KENALOG ) 0.1 % Apply 1 Application topically daily. 02/22/22   Bryan Bianchi, MD    Family History Family History  Problem Relation Age of Onset   Hypertension Mother    Diabetes Maternal Grandmother    COPD Maternal Grandmother     Social History Social History   Tobacco Use   Smoking status: Never   Smokeless tobacco: Never  Vaping Use   Vaping status: Never Used  Substance Use Topics   Alcohol use: Yes   Drug use: Not Currently     Allergies   Dust mite extract, Pollen extract, and Shellfish allergy   Review of Systems Review of Systems  HENT:  Positive for ear pain.   Respiratory:  Positive for cough.      Physical Exam Triage Vital Signs ED Triage Vitals  Encounter Vitals Group     BP 12/29/23 1324 109/81     Girls Systolic BP Percentile --      Girls Diastolic BP Percentile --      Boys Systolic BP Percentile --      Boys Diastolic BP Percentile --      Pulse Rate 12/29/23 1324 91     Resp 12/29/23 1324 18     Temp 12/29/23 1324 98.1 F (36.7 C)      Temp Source 12/29/23 1324 Oral     SpO2 12/29/23 1324 98 %     Weight --      Height --      Head Circumference --      Peak Flow --      Pain Score 12/29/23 1319 6     Pain Loc --      Pain Education --      Exclude from Growth Chart --    No data found.  Updated Vital Signs BP 109/81 (BP Location: Left Arm)   Pulse 91   Temp 98.1 F (36.7 C) (Oral)   Resp 18   LMP 12/15/2023 (Approximate)   SpO2 98%   Visual Acuity Right Eye Distance:   Left Eye Distance:   Bilateral Distance:    Right Eye Near:   Left Eye Near:    Bilateral Near:     Physical Exam Vitals reviewed.  Constitutional:      General: She is not in acute distress.    Appearance: She is not ill-appearing, toxic-appearing or diaphoretic.  HENT:     Left Ear: Tympanic membrane and ear canal normal.     Ears:     Comments: Right tympanic membrane is gray and dull with some white adherent material.  There is also white adherent material on the walls of the right canal.  I cannot completely visualize the tympanic membrane    Nose: Congestion present.     Mouth/Throat:     Mouth: Mucous membranes are moist.     Comments: There is clear mucus draining in the oropharynx Eyes:     Extraocular Movements: Extraocular movements intact.     Conjunctiva/sclera: Conjunctivae normal.     Pupils: Pupils are equal, round, and reactive to light.  Cardiovascular:     Rate and Rhythm: Normal rate and regular rhythm.     Heart sounds: No murmur heard. Pulmonary:     Effort: Pulmonary effort is normal. No respiratory distress.     Breath sounds: No stridor. No wheezing, rhonchi or rales.  Musculoskeletal:     Cervical back: Neck supple.  Lymphadenopathy:     Cervical: No cervical adenopathy.  Skin:    Capillary Refill: Capillary refill takes less than 2 seconds.     Coloration: Skin is not jaundiced or  pale.  Neurological:     General: No focal deficit present.     Mental Status: She is alert and oriented to  person, place, and time.  Psychiatric:        Behavior: Behavior normal.      UC Treatments / Results  Labs (all labs ordered are listed, but only abnormal results are displayed) Labs Reviewed  RPR  HIV ANTIBODY (ROUTINE TESTING W REFLEX)  POC SOFIA SARS ANTIGEN FIA  CERVICOVAGINAL ANCILLARY ONLY    EKG   Radiology No results found.  Procedures Procedures (including critical care time)  Medications Ordered in UC Medications - No data to display  Initial Impression / Assessment and Plan / UC Course  I have reviewed the triage vital signs and the nursing notes.  Pertinent labs & imaging results that were available during my care of the patient were reviewed by me and considered in my medical decision making (see chart for details).     COVID test is negative.  She has at least right otitis externa and possibly otitis media with perforation.  I therefore am sending in Floxin  drops plus Omnicef by mouth.  I also think she is having an asthma exacerbation, so prednisone  for a 5-day burst and an albuterol  inhaler sent in for her.  Blood is drawn for HIV and RPR, and staff will notify them if any of that is positive  Vaginal self swab is done, and we will notify of any positives on that and treat per protocol.  Final Clinical Impressions(s) / UC Diagnoses   Final diagnoses:  Viral URI  Mild intermittent asthma with (acute) exacerbation  Acute otitis externa of right ear, unspecified type  Right otitis media, unspecified otitis media type  Potential exposure to STD     Discharge Instructions      Your COVID test is negative  -Floxin  eardrops-5 drops in the affected ear 2 times daily for 7 days. Take cefdinir 300 mg--2 capsules together daily for 7 days  Albuterol  inhaler--do 2 puffs every 4 hours as needed for shortness of breath or wheezing  Take prednisone  20 mg--2 daily for 5 days  Staff will notify you if there is anything positive on the swab or on  the blood work. It can take 2-3 days for the tests to result, depending on the day of the week your test was taken. You will only be notified if there are any positives on the testing; test results will also go to your MyChart if you are signed up for MyChart.        ED Prescriptions     Medication Sig Dispense Auth. Provider   albuterol  (VENTOLIN  HFA) 108 (90 Base) MCG/ACT inhaler Inhale 2 puffs into the lungs every 4 (four) hours as needed for wheezing or shortness of breath. 1 each Vonna Sharlet POUR, MD   predniSONE  (DELTASONE ) 20 MG tablet Take 2 tablets (40 mg total) by mouth daily with breakfast for 5 days. 10 tablet Vonna Sharlet POUR, MD   ofloxacin  (FLOXIN ) 0.3 % OTIC solution Place 5 drops into both ears 2 (two) times daily for 7 days. 5 mL Vonna Sharlet POUR, MD   cefdinir (OMNICEF) 300 MG capsule Take 2 capsules (600 mg total) by mouth daily for 7 days. 14 capsule Vonna, Shaquelle Hernon K, MD      PDMP not reviewed this encounter.   Vonna Sharlet POUR, MD 12/29/23 [phone]

## 2023-12-29 NOTE — ED Triage Notes (Signed)
 Reports symptoms started 3 days ago.  Ears are sore, stuffy, has a congested cough and reports phlegm is green.  Patient reports taking mucinex\  Right ear pain described as pressure.  Initially throat hurt, but not any more.  Denies fever  Patient is asking about std testing-no symptoms currently.  Patient likes to check regularly

## 2023-12-30 ENCOUNTER — Ambulatory Visit (HOSPITAL_BASED_OUTPATIENT_CLINIC_OR_DEPARTMENT_OTHER): Payer: Self-pay

## 2023-12-30 LAB — CERVICOVAGINAL ANCILLARY ONLY
Chlamydia: NEGATIVE
Comment: NEGATIVE
Comment: NEGATIVE
Comment: NORMAL
Neisseria Gonorrhea: POSITIVE — AB
Trichomonas: POSITIVE — AB

## 2024-01-05 ENCOUNTER — Ambulatory Visit (HOSPITAL_COMMUNITY)

## 2024-03-25 ENCOUNTER — Ambulatory Visit (HOSPITAL_COMMUNITY): Payer: Self-pay
